# Patient Record
Sex: Female | Born: 1945 | Race: White | Hispanic: No | Marital: Married | Smoking: Never smoker
Health system: Southern US, Community
[De-identification: ages and names within clinical notes are randomized; demographics above are authoritative.]

## PROBLEM LIST (undated history)

## (undated) DIAGNOSIS — C679 Malignant neoplasm of bladder, unspecified: Secondary | ICD-10-CM

## (undated) DIAGNOSIS — F32A Depression, unspecified: Secondary | ICD-10-CM

## (undated) DIAGNOSIS — F329 Major depressive disorder, single episode, unspecified: Secondary | ICD-10-CM

## (undated) DIAGNOSIS — D494 Neoplasm of unspecified behavior of bladder: Secondary | ICD-10-CM

## (undated) DIAGNOSIS — I10 Essential (primary) hypertension: Secondary | ICD-10-CM

## (undated) DIAGNOSIS — H35039 Hypertensive retinopathy, unspecified eye: Secondary | ICD-10-CM

## (undated) DIAGNOSIS — K219 Gastro-esophageal reflux disease without esophagitis: Secondary | ICD-10-CM

## (undated) DIAGNOSIS — F419 Anxiety disorder, unspecified: Secondary | ICD-10-CM

## (undated) HISTORY — PX: TONSILLECTOMY: SUR1361

## (undated) HISTORY — PX: OTHER SURGICAL HISTORY: SHX169

## (undated) HISTORY — DX: Hypertensive retinopathy, unspecified eye: H35.039

---

## 1998-10-18 ENCOUNTER — Other Ambulatory Visit: Admission: RE | Admit: 1998-10-18 | Discharge: 1998-10-18 | Payer: Self-pay | Admitting: Gynecology

## 1999-12-12 ENCOUNTER — Other Ambulatory Visit: Admission: RE | Admit: 1999-12-12 | Discharge: 1999-12-12 | Payer: Self-pay | Admitting: Gynecology

## 2001-04-29 ENCOUNTER — Other Ambulatory Visit: Admission: RE | Admit: 2001-04-29 | Discharge: 2001-04-29 | Payer: Self-pay | Admitting: Gynecology

## 2002-05-11 ENCOUNTER — Other Ambulatory Visit: Admission: RE | Admit: 2002-05-11 | Discharge: 2002-05-11 | Payer: Self-pay | Admitting: Gynecology

## 2003-05-27 ENCOUNTER — Other Ambulatory Visit: Admission: RE | Admit: 2003-05-27 | Discharge: 2003-05-27 | Payer: Self-pay | Admitting: Gynecology

## 2005-02-26 ENCOUNTER — Other Ambulatory Visit: Admission: RE | Admit: 2005-02-26 | Discharge: 2005-02-26 | Payer: Self-pay | Admitting: Gynecology

## 2005-03-13 ENCOUNTER — Ambulatory Visit (HOSPITAL_COMMUNITY): Admission: RE | Admit: 2005-03-13 | Discharge: 2005-03-13 | Payer: Self-pay | Admitting: Urology

## 2005-03-13 ENCOUNTER — Encounter (INDEPENDENT_AMBULATORY_CARE_PROVIDER_SITE_OTHER): Payer: Self-pay | Admitting: Specialist

## 2005-03-13 ENCOUNTER — Ambulatory Visit (HOSPITAL_BASED_OUTPATIENT_CLINIC_OR_DEPARTMENT_OTHER): Admission: RE | Admit: 2005-03-13 | Discharge: 2005-03-13 | Payer: Self-pay | Admitting: Urology

## 2005-03-13 HISTORY — PX: OTHER SURGICAL HISTORY: SHX169

## 2006-01-28 ENCOUNTER — Ambulatory Visit (HOSPITAL_BASED_OUTPATIENT_CLINIC_OR_DEPARTMENT_OTHER): Admission: RE | Admit: 2006-01-28 | Discharge: 2006-01-28 | Payer: Self-pay | Admitting: Urology

## 2006-01-28 ENCOUNTER — Encounter (INDEPENDENT_AMBULATORY_CARE_PROVIDER_SITE_OTHER): Payer: Self-pay | Admitting: Specialist

## 2006-01-28 HISTORY — PX: OTHER SURGICAL HISTORY: SHX169

## 2007-11-12 ENCOUNTER — Ambulatory Visit: Payer: Self-pay | Admitting: Internal Medicine

## 2007-11-12 ENCOUNTER — Observation Stay (HOSPITAL_COMMUNITY): Admission: EM | Admit: 2007-11-12 | Discharge: 2007-11-13 | Payer: Self-pay | Admitting: Emergency Medicine

## 2010-07-04 ENCOUNTER — Ambulatory Visit
Admission: RE | Admit: 2010-07-04 | Discharge: 2010-07-04 | Payer: Self-pay | Source: Home / Self Care | Admitting: Urology

## 2010-07-04 HISTORY — PX: TRANSURETHRAL RESECTION OF BLADDER TUMOR WITH MITOMYCIN-C: SHX6459

## 2010-10-24 LAB — POCT HEMOGLOBIN-HEMACUE: Hemoglobin: 14.1 g/dL (ref 12.0–15.0)

## 2010-11-16 ENCOUNTER — Other Ambulatory Visit: Payer: Self-pay | Admitting: Gynecology

## 2010-12-26 NOTE — H&P (Signed)
Christy Cardenas, KRAUSZ               ACCOUNT NO.:  [id number]   MEDICAL RECORD NO.:  [id number]          PATIENT TYPE:  OBV   LOCATION:  4705                         FACILITY:  [facility]   PHYSICIAN:  Santiago Bumpers. Hensel, M.D.DATE OF BIRTH:  [birth year]   DATE OF ADMISSION:  11/12/2007  DATE OF DISCHARGE:                              HISTORY & PHYSICAL   CHIEF COMPLAINT:  Chest pain.   HISTORY OF PRESENT ILLNESS:  The patient is a 65 year old Caucasian  female with a history of bladder carcinoma here with chest pain and  syncope.  The chest pain started today after eating lunch at [employer].  She had a sandwich and fruit.  She did feel a little sick to her stomach  right after eating.  While she was driving home, she had left-sided  chest tightness that lasted a few seconds and then completely went away.  She also had left arm numbness at the time.  She had felt a very itchy  tightness after this as well.  She did take two aspirin at home and  checked the blood pressure which was around 140s/90s.  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  While she was getting this done, she had another  episode of chest pain which seemed more painful than tight and lasted a  few seconds during which she had a syncopal episode that lasted  approximately 30 seconds with bilateral arm numbness after the episode.  Her heart rate at the fire department was 95 at first and next check was  53.  Her blood pressure was 158/100 and next check with 82.  She was  diaphoretic at the time.  Of note, she has had lots of family stress.  Her mother-in-law died recently.  She was on Hospice, but expected her  to live longer than she did.  Also, the patient's mom has Alzheimer's  disease and has worsening issues, starting to forget things, that is  making her daughter upset.  She has not forgotten her yet.  Also, the  patient's daughter recently broke off  her engagement.  Currently, she  denies chest pain and denies shortness of breath.  No nausea, vomiting  or diarrhea.  No other syncopal events.  No dizziness.  No fevers.  No  cough.  No weakness or numbness currently.   MEDICATIONS:  1. Lexapro 10 mg p.o. daily.  She started taking the Lexapro recently      due to increased family stress.  She was on this previously because      of adjustment disorder from her bladder carcinoma.  2. Ambien CR p.r.n. insomnia.  3. Calcium.  4. Omega-3 fatty acids.  5. Zantac p.r.n. reflux.   ALLERGIES:  No known drug allergies.   PAST MEDICAL HISTORY:  1. 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  She has  cystoscopy every 3 months by Dr. Vonita Moss with Urology.  2. History of mood disorder, likely adjustment disorder while dealing      with cancer.  She was on Lexapro and is currently starting this      again.  3. Occasional heartburn, about 2 times a week.  She takes Zantac      p.r.n.   FAMILY HISTORY:  Mom with history of atrial fibrillation, hypertension  and Alzheimer's.  Dad died at 88 years old from leukemia.  He also had  an MI when he was 64 years old.  She has no siblings.   SOCIAL HISTORY:  She is a retired Engineer, site, Investment banker, corporate in  high school.  She denies any history of smoking.  She denies illicit  drug history.  Occasional social alcohol use.  She lives with her  husband.  As per HPI, her mother-in-law died recently.  She was on  Hospice, but died earlier than expected and her mom has Alzheimer's and  is not doing well and daughter recently broke off engagement; thus, she  has added stress in her life.   PHYSICAL EXAMINATION:  VITAL SIGNS: Temperature 98.2.  Respiratory rate  18.  Blood pressure 135/92, this is down from 154/97.  She is 98% on  room air.  GENERAL  APPEARANCE:  She is alert and in no acute distress.  HEENT:  Pupils are equal, round and reactive to light.  Extraocular  muscles are intact.  Oropharynx is without erythema.  Mucous membranes  are moist  NECK:  No thyromegaly.  No lymphadenopathy.  CARDIOVASCULAR:  Heart has regular rate and rhythm.  No murmurs, rubs or  gallops.  Pedal and radial pulses are 2+.  ABDOMEN:  Soft, nondistended and nontender.  Normoactive bowel sounds.  No hepatosplenomegaly.  No masses.  EXTREMITIES: No clubbing, cyanosis or edema.  Strength is 5/5  bilaterally upper and lower.  NEURO:  Alert and oriented x3.  Cranial nerves II-XII are intact.  PSYCH:  Slightly anxious appearing, but very alert and cooperative and  appropriate on exam.   LABS AND STUDIES:  White blood cell count 6.9, hemoglobin 13.6,  hematocrit 39.0, platelets 156, MCV 93.1, neutrophils 76%.  BMET:  Sodium 139, potassium 3.5, chloride 109, bicarb 24, BUN 15, creatinine  1.0, glucose 121, calcium 9.2, total protein 6.1, albumin 3.7, AST 20,  ALT 16, ALP 38, PTT 22.  Point of care enzymes are negative x3.  Chest x-  ray showed some hyperexpansion but no acute findings.  EKG:  Normal  sinus rhythm, 76 beats per minute, normal axis, no ST changes.   ASSESSMENT:  The patient is a 65 year old Caucasian female with a  history of bladder cancer and question of adjustment disorder here with  chest pain and syncope.   PLAN:  1. Chest pain:  The chest pain itself is atypical and point of care      enzymes and EKG have been normal.  I doubt cardiac etiology, but      given syncope, we will continue ruling out with cardiac enzymes x3,      check a morning EKG.  We will also check a fasting lipid panel and      a TSH.  Given history of occasional reflux, we will also treat with      Protonix in case of some GI source of her chest pain.  We will      place on observation.  Most likely her chest pain  is due, at least      in some part, to recent  anxiety and stress over family events and      circumstances.  We will place on telemetry and she will have an      outpatient cardiology evaluation.  2. Elevated blood pressure:  This seems to be going down.  We will      continue to monitor.  It is currently almost normal.  If it      continues to be elevated, we will add a beta blocker.  3. Syncope:  Unclear etiology, possibly due to anxiety over chest pain      and hyperventilation.  No neurological deficits on exam.  We will      continue her rule out MI workup and will not further workup syncope      unless lab work, EKG or other findings become positive.  We will      discuss whether or not any further workup is necessary for this.  4. Recent stressors:  We will continue her on her Lexapro that she      started taking recently after her mother-in-law      died.  I also advised her to have some rest and relaxation if      possible  5. Bladder cancer:  This is stable.  Per Urology as outpatient.  She      has cystoscopy every 3 months.   DISPOSITION:  Pending rule out MI and outpatient cardiology followup.      Alanda Amass, M.D.  Electronically Signed      Santiago Bumpers. Leveda Anna, M.D.  Electronically Signed    JH/MEDQ  D:  11/12/2007  T:  11/13/2007  Job:  [id number]   cc:   Luvenia Redden, M.D.  Maretta Bees. Vonita Moss, M.D.

## 2010-12-26 NOTE — Discharge Summary (Signed)
Christy Cardenas, Christy Cardenas               ACCOUNT NO.:  [id number]   MEDICAL RECORD NO.:  [id number]          PATIENT TYPE:  OBV   LOCATION:  4705                         FACILITY:  [facility]   PHYSICIAN:  Lauro Franklin, MDDATE OF BIRTH:  04/06/1946   DATE OF ADMISSION:  11/12/2007  DATE OF DISCHARGE:  11/13/2007                               DISCHARGE SUMMARY   PRIMARY CARE Cherrie Franca:  None.   REASON FOR ADMISSION:  Chest pain and syncopal episode.   DISCHARGE DIAGNOSES:  1. Vasovagal syncope secondary to vasovagal episodes.  2. Adjustment disorder.  3. Multiple stressors.  4. History of bladder cancer.   DISCHARGE MEDICATIONS:  Lexapro 10 mg p.o. daily.   HOSPITAL COURSE:  1. Chest pain:  The patient was admitted secondary to having several      episodes of itchy chest tightness.  She was ruled out for acute      coronary syndrome with 3 sets of negative point-of-care cardiac      markers as well as 3 sets of negative lab cardiac markers.  She did      not have any EKG changes or any concerning evidence on telemetry.      With the patient's descriptions and multiple current stressors, it      is likely the chest tightness is secondary to anxiety/stress and      possibly there is some complaint of heartburn as well.  Given her      minimal risk factors, I do not believe she requires further      inpatient workup.  She could potentially get an outpatient stress      treadmill test for any probability or suspicion for any coronary      artery disease.  2. Syncope:  As the patient was having pain episodes, she presented to      our department and was placed on a monitor.  There was an      approximately 30-second episode of a blacking out, and she did not      have any EKG changes and had decrease in her heart rate and her      blood pressure consistent with vasovagal syncope.  This did not      recur while she was in the hospital.  She did not have any      arrhythmias on telemetry  or on her EKG.  Therefore, no further      workup was pursued at this time.  If this recurs, the patient will      need further evaluation.  3. Bladder cancer:  The patient is followed by urologist and undergoes      cystoscopy every 3 months.  4. Adjustment disorder:  The patient was started on Lexapro in the      past and she had recently begun to take it intermittently.  She was      counseled that given her multiple life stressors currently and the      pharmacokinetics of the medication to take it daily.  She expressed      understanding.  She should  continue this, especially given some      possible anxiety/stress component to her chest pain.   CONDITION AT THE TIME OF DISCHARGE:  Improved.   DISCHARGE LABS:  Triglycerides 70, HDL 51, LDL 144, TSH 1.967, BUN 11,  creatinine 0.93, hemoglobin 13.6.   DISPOSITION:  The patient is discharged home.   DISCHARGE FOLLOWUP:  The patient was discharged to establish care with  primary care Christy Cardenas for overall health management, however, there is  no urgent need for her to be seen by an outpatient physician.   FOLLOWUP ISSUES:  1. If syncope recurs, the patient will need further evaluation.  2. Assessed by her primary care Christy Cardenas.      Lauro Franklin, MD  Electronically Signed     TCB/MEDQ  D:  11/13/2007  T:  11/14/2007  Job:  [id number]

## 2010-12-29 NOTE — Op Note (Signed)
Christy Cardenas, Christy Cardenas               ACCOUNT NO.:  [id number]   MEDICAL RECORD NO.:  [id number]          PATIENT TYPE:  AMB   LOCATION:  [facility]                         FACILITY:  [facility]   PHYSICIAN:  Maretta Bees. Vonita Moss, M.D.DATE OF BIRTH:  02/17/1946   DATE OF PROCEDURE:  03/13/2005  DATE OF DISCHARGE:                                 OPERATIVE REPORT   PREOPERATIVE DIAGNOSIS:  Bladder carcinoma.   POSTOPERATIVE DIAGNOSIS:  Bladder carcinoma.   PROCEDURE:  Cystoscopy, urethral dilation, bilateral retrograde pyelograms  with interpretation and transurethral resection of bladder tumor.   SURGEON:  Dr. Larey Dresser.   ANESTHESIA:  General.   INDICATIONS:  This 65 year old white female had the onset of gross hematuria  about six weeks ago and is sent to me for further evaluation. Pelvic  ultrasound  was suspicious for a bladder lesion. Renal ultrasound in the  office last week showed normal kidneys. Cystoscopy last week revealed  typical papillary tumor in the bladder. She was scheduled for resection of  this bladder tumor today and retrograde pyelography.   DESCRIPTION OF PROCEDURE:  The patient was brought to the operating room,  placed in lithotomy positio, the external genitalia were prepped and draped  in the usual fashion. She was cystoscoped only after dilating the urethra.  The bladder was unremarkable except for a solitary 3 cm typical papillary  tumor on the right floor of the bladder.   Bilateral retrograde pyelograms were obtained with a cone-tip catheter and  revealed nonobstructed and normal pyelocaliceal systems and ureters with no  filling defects.   The resectoscope was then inserted and this tumor was resected completely.  Fortunately it had a fairly narrow stalk for this 3 cm lesion and the total  specimen was sent to pathology and the biopsy site and surrounding mucosa  was fulgurated with electrocautery. At this point, there was no blood loss.  The patient was in  good condition, taken to the recovery room. She tolerated  the procedure well.     _________________    LJP/MEDQ  D:  03/13/2005  T:  03/13/2005  Job:  [id number]   cc:   Luvenia Redden, M.D.  [address]  [place]  [place] [address]  Fax: [phone]   Demetria Pore. Coral Spikes, M.D.  [address]  [address]  [place]  [place] [address]  Fax: [phone]

## 2010-12-29 NOTE — Op Note (Signed)
NAMEJOLENE, Christy Cardenas               ACCOUNT NO.:  [id number]   MEDICAL RECORD NO.:  [id number]          PATIENT TYPE:  AMB   LOCATION:  [facility]                         [facility]ACILITY:  [facility]   PHYSICIAN:  Maretta Bees. Vonita Moss, M.D.DATE OF BIRTH:  May 06, 1946   DATE OF PROCEDURE:  01/28/2006  DATE OF DISCHARGE:                                 OPERATIVE REPORT   PREOPERATIVE DIAGNOSIS:  Recurrent bladder carcinoma.   POSTOPERATIVE DIAGNOSIS:  Recurrent bladder carcinoma.   PROCEDURE:  Cystoscopy and cold-cup bladder biopsy of three small bladder  tumors and fulguration.   SURGEON:  Dr. Larey Dresser.   ANESTHESIA:  General.   INDICATIONS:  This 65 year old lady had a bladder tumor resected in August  2006 that was superficial and noninvasive, and she has come back for  surveillance cystoscopy.  In her last examination, she was found to have  recurrent bladder tumor.  She was brought to the OR today for further  treatment and then subsequent initiation of BCG installations in the office.   PROCEDURE:  The patient was brought to the operating room and placed in  lithotomy position.  External genitalia were prepped and draped in usual  fashion.  She was cystoscoped, and she had three small papillary tumors on  the right bladder floor.  All three lesions were totally resected with the  cold-cup bladder biopsy forceps, and the biopsy sites fulgurated with the  Bugbee electrode.  There was essentially no blood loss.  The patient  tolerated the procedure well and was taken to recovery room in good  condition.      Maretta Bees. Vonita Moss, M.D.  Electronically Signed     LJP/MEDQ  D:  01/28/2006  T:  01/29/2006  Job:  [id number]

## 2011-03-23 ENCOUNTER — Inpatient Hospital Stay: Payer: Self-pay | Admitting: Orthopedic Surgery

## 2011-03-23 DIAGNOSIS — I10 Essential (primary) hypertension: Secondary | ICD-10-CM

## 2011-05-08 LAB — POCT CARDIAC MARKERS
Myoglobin, poc: 38.5
Myoglobin, poc: 73.7
Operator id: 146091
Operator id: 146091

## 2011-05-08 LAB — CARDIAC PANEL(CRET KIN+CKTOT+MB+TROPI)
CK, MB: 1.8
Relative Index: INVALID
Total CK: 97
Troponin I: 0.01
Troponin I: <0.01
Troponin I: <0.01

## 2011-05-08 LAB — COMPREHENSIVE METABOLIC PANEL
ALT: 16
Albumin: 3.7
Alkaline Phosphatase: 38 — ABNORMAL LOW
BUN: 15
GFR calc Af Amer: >60
Potassium: 3.5
Sodium: 139
Total Protein: 6.1

## 2011-05-08 LAB — LIPID PANEL
HDL: 51
Total CHOL/HDL Ratio: 4.1
VLDL: 14

## 2011-05-08 LAB — BASIC METABOLIC PANEL
BUN: 11
Calcium: 9
Creatinine, Ser: 0.93
GFR calc non Af Amer: >60
Potassium: 4

## 2011-05-08 LAB — TSH: TSH: 1.967

## 2011-05-08 LAB — DIFFERENTIAL
Basophils Relative: 1
Monocytes Absolute: 0.5
Monocytes Relative: 7
Neutro Abs: 5.3

## 2011-05-08 LAB — CBC
Platelets: 156
RDW: 13.2

## 2011-07-27 ENCOUNTER — Other Ambulatory Visit: Payer: Self-pay | Admitting: Urology

## 2011-07-27 ENCOUNTER — Encounter (HOSPITAL_COMMUNITY): Payer: Self-pay | Admitting: *Deleted

## 2011-07-27 ENCOUNTER — Encounter (HOSPITAL_COMMUNITY): Payer: Self-pay | Admitting: Pharmacy Technician

## 2011-07-30 MED ORDER — MITOMYCIN CHEMO FOR BLADDER INSTILLATION 40 MG: 40.0000 mg | Freq: Once | INTRAVENOUS | Status: DC

## 2011-07-30 MED ORDER — MITOMYCIN CHEMO FOR BLADDER INSTILLATION 40 MG
40.0000 mg | Freq: Once | INTRAVENOUS | Status: DC
  Filled 2011-07-30: qty 40

## 2011-07-30 NOTE — H&P (Addendum)
History of Present Illness            F/u bladder cancer.  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  -- Nov 2011 TURBT, Bilat RGP and post-op [facility] for a 2cm left lateral tumor. RGP's were negative. 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  -- Jan 2012 completed BCG second course -- Apr 2012 negative cystoscopy -- Sept 2012 negative cystocopy. She did not want to start maintenance BCG.   Today, she is well. No pain. No dysuria or hematuria.      Past Medical History Problems  1. History of  Bladder Cancer V10.51 2. History of  Bladder Cancer V10.51 3. History of  Urinary Tract Infection V13.02  Surgical History Problems  1. History of  Bladder Surgery 2. History of  Cystoscopy With Fulguration Medium Lesion (2-5cm)  Current Meds 1. Amoxicillin-Pot Clavulanate 875-125 MG Oral Tablet; Therapy: 29Nov2012 to 2. Calcium TABS; Therapy: (Recorded:21Apr2008) to 3. Escitalopram Oxalate 10 MG Oral Tablet; TAKE 1 TABLET BY MOUTH EVERY DAY; Therapy:  05Apr2012 to 4. Multi-Vitamin TABS; Therapy: (Recorded:21Apr2008) to 5. Premarin 0.625 MG/GM Vaginal Cream; INSERT 1 APPLICATORFUL(1-2 GRAMS) 3 TIMES  WEEKLY; Therapy: 19Apr2012 to  Allergies Medication  1. No Known Drug Allergies  Family History Problems  1. Paternal history of  Family Health Status Number Of Children 1-SON 1-DAUGHTER 2. Paternal history of  Leukemia V16.6 3. Maternal history of  Nephrolithiasis  Social History Problems  1. Alcohol Use Socially 2. Caffeine Use 2 3. Paternal history of  Death In The Family Father 4. Marital History - Currently Married 5. 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 No acute distress. Amended By: Jerilee Field; 07/17/2011 3:06 PMEST.  ENT:. The ears and nose are normal in appearance. Amended By: Jerilee Field; 07/17/2011 3:06 PMEST.  Neck: The appearance of the neck is normal Amended By: Jerilee Field; 07/17/2011 3:06 PMEST  and no neck mass is present Amended By: Jerilee Field; 07/17/2011 3:06 PMEST.  Pulmonary: No respiratory distress Amended By: Jerilee Field; 07/17/2011 3:06 PMEST  and normal respiratory rhythm and effort Amended By: Jerilee Field; 07/17/2011 3:06 PMEST.  Cardiovascular: Heart rate and rhythm are normal Amended By: Jerilee Field; 07/17/2011 3:06 PMEST . No peripheral edema. Amended By: Jerilee Field; 07/17/2011 3:06 PMEST.  Abdomen: The abdomen is soft and nontender Amended By: Jerilee Field; 07/17/2011 3:06 PMEST No masses are palpated Amended By: Jerilee Field; 07/17/2011 3:06 PMEST No CVA tenderness Amended By: Jerilee Field; 07/17/2011 3:06 PMEST. No hernias are palpable Amended By: Jerilee Field; 07/17/2011 3:06 PMEST No hepatosplenomegaly noted Amended By: Jerilee Field; 07/17/2011 3:06 PMEST  Genitourinary: Examination of the external genitalia shows normal female external genitalia Amended By: Jerilee Field; 07/17/2011 3:06 PMEST  and no lesions Amended By: Jerilee Field; 07/17/2011 3:06 PMEST. The urethra is normal in appearance Amended By: Jerilee Field; 07/17/2011 3:06 PMEST  and not tender Amended By: Jerilee Field; 07/17/2011 3:06 PMEST. There is no urethral mass Amended By: Jerilee Field; 07/17/2011 3:06 PMEST Vaginal exam demonstrates no  abnormalities Amended By: Jerilee Field; 07/17/2011 3:06 PMEST. The adnexa are palpably normal Amended By: Jerilee Field; 07/17/2011 3:06 PMEST.  The bladder is non tender Amended By: Jerilee Field; 07/17/2011 3:06 PMEST , not distended Amended By: Jerilee Field; 07/17/2011 3:06 PMEST  and without masses Amended By: Jerilee Field; 07/17/2011 3:06 PMEST. The anus is normal on inspection Amended By: Jerilee Field; 07/17/2011 3:06 PMEST The perineum is normal on inspection. Amended By: Jerilee Field; 07/17/2011 3:06 PMEST.  Lymphatics: The femoral Amended By: Jerilee Field; 07/17/2011 3:06 PMEST and inguinal Amended By: Jerilee Field; 07/17/2011 3:06 PMEST nodes are not enlarged or tender.  Skin: Normal skin turgor Amended By: Jerilee Field; 07/17/2011 3:06 PMEST , no visible rash Amended By: Jerilee Field; 07/17/2011 3:06 PMEST  and no visible skin lesions Amended By: Jerilee Field; 07/17/2011 3:06 PMEST.  Neuro/Psych:. Mood and affect are appropriate. Amended By: Jerilee Field; 07/17/2011 3:06 PMEST.    Results/Data Urine [Data Includes: Last 1 Day]   04Dec2012  COLOR YELLOW   APPEARANCE CLEAR   SPECIFIC GRAVITY 1.020   pH 6.0   GLUCOSE NEG mg/dL  BILIRUBIN NEG   KETONE NEG mg/dL  BLOOD NEG   PROTEIN NEG mg/dL  UROBILINOGEN 0.2 mg/dL  NITRITE NEG   LEUKOCYTE ESTERASE NEG    Procedure  Procedure: Cystoscopy  Informed Consent: from the patient . Specific risks including, but not limited to bleeding, infection, pain, allergic reaction etc. were explained.  Prep: The patient was prepped with betadine.  Procedure Note:  Urethral meatus:. No abnormalities.  Anterior urethra: No abnormalities.  Bladder: Visulization was clear. The ureteral orifices were in the normal anatomic position bilaterally and had clear efflux of urine. A systematic survey of the bladder demonstrated no bladder tumors or stones.  The mucosa at the prior resection sites - right lateral and left lateral had neovascularity c/w prior resection and some erythema. The mucosa was smooth without abnormalities. The patient  tolerated the procedure well.  Complications: None.    Assessment Assessed  1. Bladder Cancer 188.9   Recurrent LG Ta bladder Ca. Cytology sent today.   Plan Bladder Cancer (188.9)  1. Cysto 2. URINE CYTOLOGY  Requested for: 04Dec2012 3. Follow-up Month x 1 Office  Follow-up  Requested for: 04Dec2012 Health Maintenance (V70.0)  4. UA With REFLEX  Done: 04Dec2012 02:06PM  Patient cytology suspicious for malignancy (rule out low grade neoplasm). I discussed with patient the nature, potential benefits, risks and alternatives to cystoscopy, bladder biopsy, bilateral RGP possible ureteroscopy/stent, instill Mitpmycin-C, including side effects of the proposed treatment, the likelihood of the patient achieving the goals of the procedure, and any potential problems that might occur during the procedure or recuperation. All questions answered. Patient elects to proceed as planned. We discussed alternatives such as non-treatment.

## 2011-07-31 ENCOUNTER — Ambulatory Visit (HOSPITAL_COMMUNITY): Payer: BC Managed Care – PPO | Admitting: Registered Nurse

## 2011-07-31 ENCOUNTER — Encounter (HOSPITAL_COMMUNITY): Payer: Self-pay | Admitting: *Deleted

## 2011-07-31 ENCOUNTER — Encounter (HOSPITAL_COMMUNITY): Payer: Self-pay | Admitting: Registered Nurse

## 2011-07-31 ENCOUNTER — Other Ambulatory Visit: Payer: Self-pay | Admitting: Urology

## 2011-07-31 ENCOUNTER — Encounter (HOSPITAL_COMMUNITY)
Admission: RE | Disposition: A | Discharge status: Home / Self Care | Payer: Self-pay | Source: Ambulatory Visit | Attending: Urology

## 2011-07-31 ENCOUNTER — Ambulatory Visit (HOSPITAL_COMMUNITY)
Admission: RE | Admit: 2011-07-31 | Discharge: 2011-07-31 | Disposition: A | Discharge status: Home / Self Care | Payer: BC Managed Care – PPO | Source: Ambulatory Visit | Attending: Urology | Admitting: Urology

## 2011-07-31 DIAGNOSIS — Z79899 Other long term (current) drug therapy: Secondary | ICD-10-CM | POA: Insufficient documentation

## 2011-07-31 DIAGNOSIS — C679 Malignant neoplasm of bladder, unspecified: Secondary | ICD-10-CM | POA: Insufficient documentation

## 2011-07-31 DIAGNOSIS — R9389 Abnormal findings on diagnostic imaging of other specified body structures: Secondary | ICD-10-CM | POA: Insufficient documentation

## 2011-07-31 HISTORY — DX: Malignant neoplasm of bladder, unspecified: C67.9

## 2011-07-31 HISTORY — PX: CYSTOSCOPY WITH BIOPSY: SHX5122

## 2011-07-31 HISTORY — PX: CYSTOSCOPY/RETROGRADE/URETEROSCOPY: SHX5316

## 2011-07-31 LAB — CBC
HCT: 39.8 % (ref 36.0–46.0)
Hemoglobin: 13.2 g/dL (ref 12.0–15.0)
MCHC: 33.2 g/dL (ref 30.0–36.0)
MCV: 95.2 fL (ref 78.0–100.0)

## 2011-07-31 LAB — SURGICAL PCR SCREEN: Staphylococcus aureus: NEGATIVE

## 2011-07-31 SURGERY — CYSTOSCOPY/RETROGRADE/URETEROSCOPY
Anesthesia: General | Site: Ureter | Wound class: Clean Contaminated

## 2011-07-31 MED ORDER — MUPIROCIN 2 % EX OINT
TOPICAL_OINTMENT | CUTANEOUS | Status: AC
  Filled 2011-07-31: qty 22

## 2011-07-31 MED ORDER — PHENAZOPYRIDINE HCL 200 MG PO TABS: 200.0000 mg | ORAL_TABLET | Freq: Three times a day (TID) | ORAL | Status: AC | PRN

## 2011-07-31 MED ORDER — MIDAZOLAM HCL 5 MG/5ML IJ SOLN
INTRAMUSCULAR | Status: DC | PRN
  Administered 2011-07-31: 2 mg via INTRAVENOUS

## 2011-07-31 MED ORDER — SODIUM CHLORIDE 0.9 % IR SOLN
Status: DC | PRN
  Administered 2011-07-31: 3000 mL

## 2011-07-31 MED ORDER — CEFAZOLIN SODIUM 1-5 GM-% IV SOLN
INTRAVENOUS | Status: AC
  Filled 2011-07-31: qty 50

## 2011-07-31 MED ORDER — OXYCODONE-ACETAMINOPHEN 5-325 MG PO TABS: 1.0000 | ORAL_TABLET | ORAL | Status: AC | PRN

## 2011-07-31 MED ORDER — PROPOFOL 10 MG/ML IV EMUL
INTRAVENOUS | Status: DC | PRN
  Administered 2011-07-31: 200 mg via INTRAVENOUS

## 2011-07-31 MED ORDER — IOHEXOL 300 MG/ML  SOLN
INTRAMUSCULAR | Status: DC | PRN
  Administered 2011-07-31: 20 mL

## 2011-07-31 MED ORDER — SULFAMETHOXAZOLE-TRIMETHOPRIM 800-160 MG PO TABS: 1.0000 | ORAL_TABLET | Freq: Two times a day (BID) | ORAL | Status: AC

## 2011-07-31 MED ORDER — STERILE WATER FOR IRRIGATION IR SOLN
Status: DC | PRN
  Administered 2011-07-31: 3000 mL

## 2011-07-31 MED ORDER — IOHEXOL 300 MG/ML  SOLN
INTRAMUSCULAR | Status: AC
  Filled 2011-07-31: qty 1

## 2011-07-31 MED ORDER — LACTATED RINGERS IV SOLN
INTRAVENOUS | Status: DC
  Administered 2011-07-31: 1000 mL via INTRAVENOUS

## 2011-07-31 MED ORDER — DEXAMETHASONE SODIUM PHOSPHATE 10 MG/ML IJ SOLN
INTRAMUSCULAR | Status: DC | PRN
  Administered 2011-07-31: 10 mg via INTRAVENOUS

## 2011-07-31 MED ORDER — DOCUSATE SODIUM 100 MG PO CAPS: 100.0000 mg | ORAL_CAPSULE | Freq: Two times a day (BID) | ORAL | Status: AC

## 2011-07-31 MED ORDER — FENTANYL CITRATE 0.05 MG/ML IJ SOLN
INTRAMUSCULAR | Status: DC | PRN
  Administered 2011-07-31 (×2): 25 ug via INTRAVENOUS
  Administered 2011-07-31: 50 ug via INTRAVENOUS

## 2011-07-31 MED ORDER — CEFAZOLIN SODIUM 1-5 GM-% IV SOLN
1.0000 g | INTRAVENOUS | Status: AC
  Administered 2011-07-31: 1 g via INTRAVENOUS

## 2011-07-31 MED ORDER — FENTANYL CITRATE 0.05 MG/ML IJ SOLN: 25.0000 ug | INTRAMUSCULAR | Status: DC | PRN

## 2011-07-31 MED ORDER — LIDOCAINE HCL (CARDIAC) 20 MG/ML IV SOLN
INTRAVENOUS | Status: DC | PRN
  Administered 2011-07-31: 80 mg via INTRAVENOUS

## 2011-07-31 MED ORDER — PROMETHAZINE HCL 25 MG/ML IJ SOLN: 6.2500 mg | INTRAMUSCULAR | Status: DC | PRN

## 2011-07-31 MED ORDER — EPHEDRINE SULFATE 50 MG/ML IJ SOLN
INTRAMUSCULAR | Status: DC | PRN
  Administered 2011-07-31 (×2): 5 mg via INTRAVENOUS

## 2011-07-31 MED ORDER — LACTATED RINGERS IV SOLN: INTRAVENOUS | Status: DC

## 2011-07-31 MED ORDER — ONDANSETRON HCL 4 MG/2ML IJ SOLN
INTRAMUSCULAR | Status: DC | PRN
  Administered 2011-07-31: 4 mg via INTRAVENOUS

## 2011-07-31 SURGICAL SUPPLY — 25 items
ADAPTER CATH URET PLST 4-6FR (CATHETERS) ×3 IMPLANT
BAG URO CATCHER STRL LF (DRAPE) ×3 IMPLANT
BASKET STNLS GEMINI 4WIRE 3FR (BASKET) IMPLANT
CATH INTERMIT  6FR 70CM (CATHETERS) ×3 IMPLANT
CATH ROBINSON RED A/P 16FR (CATHETERS) IMPLANT
CATH URET 5FR 28IN CONE TIP (BALLOONS)
CATH URET 5FR 70CM CONE TIP (BALLOONS) IMPLANT
CLOTH BEACON ORANGE TIMEOUT ST (SAFETY) ×3 IMPLANT
DRAPE CAMERA CLOSED 9X96 (DRAPES) ×3 IMPLANT
ELECT REM PT RETURN 9FT ADLT (ELECTROSURGICAL) ×3
ELECTRODE REM PT RTRN 9FT ADLT (ELECTROSURGICAL) ×2 IMPLANT
GLOVE BIOGEL M STRL SZ7.5 (GLOVE) ×3 IMPLANT
GOWN PREVENTION PLUS XLARGE (GOWN DISPOSABLE) ×3 IMPLANT
GOWN STRL NON-REIN LRG LVL3 (GOWN DISPOSABLE) ×3 IMPLANT
GOWN STRL REIN XL XLG (GOWN DISPOSABLE) ×3 IMPLANT
GUIDEWIRE ANG ZIPWIRE 038X150 (WIRE) IMPLANT
GUIDEWIRE STR DUAL SENSOR (WIRE) ×6 IMPLANT
MANIFOLD NEPTUNE II (INSTRUMENTS) ×3 IMPLANT
NDL SAFETY ECLIPSE 18X1.5 (NEEDLE) IMPLANT
NEEDLE HYPO 18GX1.5 SHARP (NEEDLE)
NEEDLE HYPO 22GX1.5 SAFETY (NEEDLE) IMPLANT
PACK CYSTO (CUSTOM PROCEDURE TRAY) ×3 IMPLANT
STENT CONTOUR URETERAL (STENTS) ×3 IMPLANT
TUBING CONNECTING 10 (TUBING) ×3 IMPLANT
WATER STERILE IRR 3000ML UROMA (IV SOLUTION) ×3 IMPLANT

## 2011-07-31 NOTE — Brief Op Note (Signed)
07/31/2011  11:13 AM  PATIENT:  Christy Cardenas  65 y.o. female  PRE-OPERATIVE DIAGNOSIS:  Bladder Cancer  POST-OPERATIVE DIAGNOSIS:  bladder cancer  PROCEDURE:  Procedure(s): Exam under anesthesia.  CYSTOSCOPY/ left and right RETROGRADE/left and right URETEROSCOPY CYSTOSCOPY WITH BIOPSY and fulguration  SURGEON:  Surgeon(s): Antony Haste, MD  ANESTHESIA:   gen   Findings: EUA - normal abd no mass, normal urethra and bladder. Cysto - "rippled" mucosa over the right bladder base. No other lesion.   Left RGP - filling defect distal ureter at UVJ, otherwise normal distal, mid and prox ureter; normal renal pelvis and collecting system  Left URS - normal distal and mid ureter.   Right RGP - filling defect and dilation distal ureter, filling defect right upper infundibulum with dilated upper pole  Right URS - normal ureter, normal renal pelvis and collecting system (upper infundibulum normal connecting to a compound RUP calyx)   EBL:  Total I/O In: 1700 [I.V.:1700] Out: 25 [Blood:25]  BLOOD ADMINISTERED:none  DRAINS: 6 x 24 cm right ureteral stent  LOCAL MEDICATIONS USED:  NONE  SPECIMEN:  No Specimen and Biopsy / Limited Resection - right bladder biopsy x 2   DISPOSITION OF SPECIMEN:  PATHOLOGY  COUNTS:  YES  DICTATION: [id number]   PLAN OF CARE: Discharge to home after PACU  PATIENT DISPOSITION:  PACU - hemodynamically stable.   Delay start of Pharmacological VTE agent (>24hrs) due to surgical blood loss or risk of bleeding:  {YES/NO/NOT APPLICABLE:20182

## 2011-07-31 NOTE — Anesthesia Preprocedure Evaluation (Addendum)
Anesthesia Evaluation  Patient identified by MRN, date of birth, ID band Patient awake    Reviewed: Allergy & Precautions, H&P , NPO status , Patient's Chart, lab work & pertinent test results  Airway Mallampati: II TM Distance: >3 FB Neck ROM: full    Dental No notable dental hx. (+) Teeth Intact and Dental Advisory Given   Pulmonary neg pulmonary ROS,  clear to auscultation  Pulmonary exam normal       Cardiovascular Exercise Tolerance: Good neg cardio ROS regular Normal    Neuro/Psych Negative Neurological ROS  Negative Psych ROS   GI/Hepatic negative GI ROS, Neg liver ROS,   Endo/Other  Negative Endocrine ROS  Renal/GU negative Renal ROS  Genitourinary negative   Musculoskeletal   Abdominal   Peds  Hematology negative hematology ROS (+)   Anesthesia Other Findings   Reproductive/Obstetrics negative OB ROS                          Anesthesia Physical Anesthesia Plan  ASA: I  Anesthesia Plan: General   Post-op Pain Management:    Induction: Intravenous  Airway Management Planned: LMA  Additional Equipment:   Intra-op Plan:   Post-operative Plan:   Informed Consent: I have reviewed the patients History and Physical, chart, labs and discussed the procedure including the risks, benefits and alternatives for the proposed anesthesia with the patient or authorized representative who has indicated his/her understanding and acceptance.   Dental Advisory Given  Plan Discussed with: CRNA and Surgeon  Anesthesia Plan Comments:         Anesthesia Quick Evaluation  

## 2011-07-31 NOTE — Transfer of Care (Signed)
Immediate Anesthesia Transfer of Care Note  Patient: Christy Cardenas  Procedure(s) Performed:  CYSTOSCOPY/RETROGRADE/URETEROSCOPY - CYSTOSCOPY BILATERAL RETROGRADE PYELOGRAM URETEROSCOPY Right ureteral stent placement  C-ARM ; CYSTOSCOPY WITH BIOPSY - BLADDER BIOPSY  Patient Location: PACU  Anesthesia Type: General  Level of Consciousness: awake, alert  and oriented  Airway & Oxygen Therapy: Patient Spontanous Breathing and Patient connected to face mask oxygen  Post-op Assessment: Report given to PACU RN and Post -op Vital signs reviewed and stable  Post vital signs: Reviewed and stable  Complications: No apparent anesthesia complications

## 2011-07-31 NOTE — Preoperative (Signed)
Beta Blockers   Reason not to administer Beta Blockers:Not Applicable 

## 2011-07-31 NOTE — Anesthesia Postprocedure Evaluation (Signed)
  Anesthesia Post-op Note  Patient: Christy Cardenas  Procedure(s) Performed:  CYSTOSCOPY/RETROGRADE/URETEROSCOPY - CYSTOSCOPY BILATERAL RETROGRADE PYELOGRAM URETEROSCOPY Right ureteral stent placement  C-ARM ; CYSTOSCOPY WITH BIOPSY - BLADDER BIOPSY  Patient Location: PACU  Anesthesia Type: General  Level of Consciousness: awake and alert   Airway and Oxygen Therapy: Patient Spontanous Breathing  Post-op Pain: mild  Post-op Assessment: Post-op Vital signs reviewed, Patient's Cardiovascular Status Stable, Respiratory Function Stable, Patent Airway and No signs of Nausea or vomiting  Post-op Vital Signs: stable  Complications: No apparent anesthesia complications

## 2011-07-31 NOTE — Op Note (Signed)
Christy Cardenas, LYBARGER NO.:  [id number]  MEDICAL RECORD NO.:  [id number]  LOCATION:  [facility]                         FACILITY:  [facility]  PHYSICIAN:  Jerilee Field, MD   DATE OF BIRTH:  1946-07-03  DATE OF PROCEDURE: DATE OF DISCHARGE:                              OPERATIVE REPORT   PREOPERATIVE DIAGNOSIS:  Bladder cancer.  POSTOPERATIVE DIAGNOSIS:  Bladder cancer.  PROCEDURE:  Exam under anesthesia with cystoscopy, bladder biopsy and fulguration, left retrograde pyelogram, left ureteroscopy, right retrograde pyelogram, right ureteroscopy and right ureteral stent placement.  SURGEON:  Jerilee Field, MD  TYPE OF ANESTHESIA:  General.  INDICATION FOR PROCEDURE:  Christy Cardenas is a 65 year old female.  She has a history of bladder cancer in 2007.  She had recurrence in 2011 of low- grade disease.  Her surveillance cystos had been normal up until this month where the mucosa on the right side of her bladder looks slightly abnormal, but no obvious tumors.  Pathology was obtained which was suspicious for malignancy, rule out low-grade neoplasia.  Discussed with the patient the nature, risks, benefits, and alternatives to cystoscopy, bladder biopsy, retrograde pyelograms, possible ureteroscopy, and stent placement.  All questions were answered and she elected to proceed.  We also discussed possible postoperative mitomycin-C instillation.  FINDINGS:  On exam under anesthesia, the patient had normal abdomen without masses, soft and nontender.  On bimanual exam, the urethra and the bladder were normal without palpable mass.  On cystoscopy, a 12- degree and 70-degree lens were used to completely evaluate the bladder. The only abnormality were overall the right bladder base.  The mucosa appeared rippled and appeared to be like a very early neoplastic process.  There was no areas of erythema.  The left biopsy site look well-healed with dyspnea vascularity.  There was no  abnormal mucosa.  LEFT RETROGRADE PYELOGRAM INTERPRETATION:  The left ureter had a circular filling defect in the distal ureter, possible stone or tumor. The catheter had been primed to get all the air out.  The remainder of the ureter, distal mid and proximal ureter appeared normal without dilation or filling defect.  There was also a normal left collecting system, calices, and renal pelvis.  LEFT URETEROSCOPY:  On left ureteroscopy, the left distal and mid ureter were confirmed to be normal without any tumor or stone.  INTERPRETATION OF RIGHT RETROGRADE PYELOGRAM:  Right retrograde pyelogram revealed 2 filling defects in the right distal ureter.  Again, the catheter had been primed and all air thought to be evacuated.  These filling defects did not appear to move.  There were 2 round filling defects consistent with either stone or tumor.  There is some slight dilation of the right distal ureter.  The mid and proximal ureter appeared normal.  The renal pelvis, mid and lower calices and infundibula appeared normal.  The right upper infundibulum never filled out with contrast and the right upper calyx appeared dilated with possible filling defect in the right upper infundibulum.  RIGHT URETEROSCOPY:  On ureteroscopy, the ureter distally mid and proximally was confirmed to be normal without stone or tumor.  The upper middle and lower calices were carefully inspected and  noted to be normal.  The right upper infundibulum was rather large, leading to a complex compound right upper calyx which was normal.  DESCRIPTION OF PROCEDURE:  After consent was obtained, the patient was brought to the operating room.  A time-out was performed to confirm the patient and procedure.  After adequate anesthesia, she was placed in lithotomy position and exam under anesthesia was performed.  She was then prepped and draped in the usual fashion.  The cystoscope was passed per urethra.  The bladder was  inspected in its entirety with a 12 and 70- degree lens with previous findings.  The only abnormalities on the right bladder base were then biopsied with cold cup x2 and sent to Pathology. These areas were then fulgurated as well as any abnormal mucosa around these areas.  Using a 6-French open-ended catheter, a left retrograde pyelogram was obtained.  The left retrograde injection of contrast after cannulating the left ureteral orifice with previous findings.  Given the findings on the retrograde, a sensor wire was advanced into the left ureter up into the left upper pole.  A semi rigid ureteroscope was then advanced without difficulty.  The intramural ureter was normal.  The left distal ureter was normal, as well as the mid ureter.  Given that the rest of the retrograde pyelogram was normal, I did not go above the iliacs on the left after confirming normal mid and distal ureter.  The wire was then removed from the left ureteral orifice.  Of note, after the biopsies, the bladder was copiously irrigated with water.  Using the rigid ureteroscope, the right ureteral orifice was cannulated with a sensor wire and it was coiled in the upper pole of the right kidney.  In a similar fashion, the ureteroscope was passed into the right side. Intramural ureter was normal.  The distal and mid ureter were normal. The rigid ureteroscope easily advanced over the iliacs, up into the proximal ureter and this appeared normal.  A 2nd wire was then advanced and coiled in the kidney through the ureteroscope and it was removed. Over the 2nd wire, a flexible ureteroscope was advanced into the renal pelvis.  The wire was removed.  Inspection of the upper, middle, and lower pole of the kidney revealed again normal system as well as a normal renal pelvis.  The ureter was carefully inspected on the way out and again confirmed to be normal.  Given bilateral procedure, the wire was back loaded on the cystoscope and a  6 x 24 cm right ureteral stent was placed.  The string was left on the stent.  The patient's bladder was drained and the scope was removed.  I did not give her mitomycin-C given the stent in place.  ESTIMATED BLOOD LOSS:  Minimal.  COMPLICATIONS:  None.  SPECIMENS:  Right bladder biopsy x2 sent to Pathology.  DISPOSITION:  The patient stable to PACU.          ______________________________ Jerilee Field, MD     ME/MEDQ  D:  07/31/2011  T:  07/31/2011  Job:  [id number]

## 2011-07-31 NOTE — Interval H&P Note (Signed)
History and Physical Interval Note:  07/31/2011 9:45 AM  Christy Cardenas  has presented today for surgery, with the diagnosis of Bladder Cancer  The various methods of treatment have been discussed with the patient and family. After consideration of risks, benefits and other options for treatment, the patient has consented to  Procedure(s): CYSTOSCOPY/RETROGRADE/URETEROSCOPY/BLADDER BIOPSY/FULGURATION/MITOMYCIN-C INSTILLATION CYSTOSCOPY WITH BIOPSY as a surgical intervention .  The patients' history has been reviewed, patient examined, no change in status, stable for surgery.  I have reviewed the patients' chart and labs.  Questions were answered to the patient's satisfaction.     Antony Haste

## 2011-08-02 ENCOUNTER — Encounter (HOSPITAL_COMMUNITY): Payer: Self-pay | Admitting: Urology

## 2012-01-23 ENCOUNTER — Emergency Department (HOSPITAL_COMMUNITY)
Admission: EM | Admit: 2012-01-23 | Discharge: 2012-01-23 | Disposition: A | Discharge status: Home / Self Care | Payer: BC Managed Care – PPO | Attending: Emergency Medicine | Admitting: Emergency Medicine

## 2012-01-23 ENCOUNTER — Encounter (HOSPITAL_COMMUNITY): Payer: Self-pay

## 2012-01-23 ENCOUNTER — Emergency Department (HOSPITAL_COMMUNITY): Payer: BC Managed Care – PPO

## 2012-01-23 DIAGNOSIS — Z8551 Personal history of malignant neoplasm of bladder: Secondary | ICD-10-CM | POA: Insufficient documentation

## 2012-01-23 DIAGNOSIS — F3289 Other specified depressive episodes: Secondary | ICD-10-CM | POA: Insufficient documentation

## 2012-01-23 DIAGNOSIS — M25569 Pain in unspecified knee: Secondary | ICD-10-CM | POA: Insufficient documentation

## 2012-01-23 DIAGNOSIS — F329 Major depressive disorder, single episode, unspecified: Secondary | ICD-10-CM | POA: Insufficient documentation

## 2012-01-23 DIAGNOSIS — Z79899 Other long term (current) drug therapy: Secondary | ICD-10-CM | POA: Insufficient documentation

## 2012-01-23 HISTORY — DX: Major depressive disorder, single episode, unspecified: F32.9

## 2012-01-23 HISTORY — DX: Depression, unspecified: F32.A

## 2012-01-23 MED ORDER — ONDANSETRON HCL 4 MG/2ML IJ SOLN
INTRAMUSCULAR | Status: AC
  Filled 2012-01-23: qty 2

## 2012-01-23 MED ORDER — HYDROCODONE-ACETAMINOPHEN 5-325 MG PO TABS: 2.0000 | ORAL_TABLET | ORAL | Status: AC | PRN

## 2012-01-23 MED ORDER — HYDROCODONE-ACETAMINOPHEN 5-325 MG PO TABS
1.0000 | ORAL_TABLET | Freq: Once | ORAL | Status: AC
  Administered 2012-01-23: 1 via ORAL
  Filled 2012-01-23: qty 1

## 2012-01-23 MED ORDER — HYDROCODONE-ACETAMINOPHEN 5-325 MG PO TABS: 2.0000 | ORAL_TABLET | ORAL | Status: DC | PRN

## 2012-01-23 NOTE — Progress Notes (Signed)
Orthopedic Tech Progress Note Patient Details:  Christy Cardenas [birth year] [id number]  Ortho Devices Type of Ortho Device: Crutches;Knee Immobilizer Ortho Device/Splint Location: (L) LE Ortho Device/Splint Interventions: Application   Asia R Thompson 01/23/2012, 7:23 PM

## 2012-01-23 NOTE — ED Notes (Signed)
IV in left hand was removed PT discharge

## 2012-01-23 NOTE — ED Notes (Signed)
Pt given ice pack on arrival

## 2012-01-23 NOTE — Discharge Instructions (Signed)
Follow up with your providers as dicussed in the ED today and as written above.  See your doctor immediately--or return to the ED--with any new or troubling symptoms including fevers, weakness, new chest pain, shortness or breath, numbness, or any other concerning symptom.    Knee Pain  The knee is the complex joint between your thigh and your lower leg. It is made up of bones, tendons, ligaments, and cartilage. The bones that make up the knee are:  The femur in the thigh.   The tibia and fibula in the lower leg.   The patella or kneecap riding in the groove on the lower femur.  CAUSES  Knee pain is a common complaint with many causes. A few of these causes are:  Injury, such as:   A ruptured ligament or tendon injury.   Torn cartilage.   Medical conditions, such as:   Gout   Arthritis   Infections   Overuse, over training or overdoing a physical activity.  Knee pain can be minor or severe. Knee pain can accompany debilitating injury. Minor knee problems often respond well to self-care measures or get well on their own. More serious injuries may need medical intervention or even surgery. SYMPTOMS The knee is complex. Symptoms of knee problems can vary widely. Some of the problems are:  Pain with movement and weight bearing.   Swelling and tenderness.   Buckling of the knee.   Inability to straighten or extend your knee.   Your knee locks and you cannot straighten it.   Warmth and redness with pain and fever.   Deformity or dislocation of the kneecap.  DIAGNOSIS  Determining what is wrong may be very straight forward such as when there is an injury. It can also be challenging because of the complexity of the knee. Tests to make a diagnosis may include:  Your caregiver taking a history and doing a physical exam.   Routine X-rays can be used to rule out other problems. X-rays will not reveal a cartilage tear. Some injuries of the knee can be diagnosed by:    Arthroscopy a surgical technique by which a small video camera is inserted through tiny incisions on the sides of the knee. This procedure is used to examine and repair internal knee joint problems. Tiny instruments can be used during arthroscopy to repair the torn knee cartilage (meniscus).   Arthrography is a radiology technique. A contrast liquid is directly injected into the knee joint. Internal structures of the knee joint then become visible on X-ray film.   An MRI scan is a non x-ray radiology procedure in which magnetic fields and a computer produce two- or three-dimensional images of the inside of the knee. Cartilage tears are often visible using an MRI scanner. MRI scans have largely replaced arthrography in diagnosing cartilage tears of the knee.   Blood work.   Examination of the fluid that helps to lubricate the knee joint (synovial fluid). This is done by taking a sample out using a needle and a syringe.  TREATMENT The treatment of knee problems depends on the cause. Some of these treatments are:  Depending on the injury, proper casting, splinting, surgery or physical therapy care will be needed.   Give yourself adequate recovery time. Do not overuse your joints. If you begin to get sore during workout routines, back off. Slow down or do fewer repetitions.   For repetitive activities such as cycling or running, maintain your strength and nutrition.   Alternate muscle  groups. For example if you are a weight lifter, work the upper body on one day and the lower body the next.   Either tight or weak muscles do not give the proper support for your knee. Tight or weak muscles do not absorb the stress placed on the knee joint. Keep the muscles surrounding the knee strong.   Take care of mechanical problems.   If you have flat feet, orthotics or special shoes may help. See your caregiver if you need help.   Arch supports, sometimes with wedges on the inner or outer aspect of the  heel, can help. These can shift pressure away from the side of the knee most bothered by osteoarthritis.   A brace called an "unloader" brace also may be used to help ease the pressure on the most arthritic side of the knee.   If your caregiver has prescribed crutches, braces, wraps or ice, use as directed. The acronym for this is PRICE. This means protection, rest, ice, compression and elevation.   Nonsteroidal anti-inflammatory drugs (NSAID's), can help relieve pain. But if taken immediately after an injury, they may actually increase swelling. Take NSAID's with food in your stomach. Stop them if you develop stomach problems. Do not take these if you have a history of ulcers, stomach pain or bleeding from the bowel. Do not take without your caregiver's approval if you have problems with fluid retention, heart failure, or kidney problems.   For ongoing knee problems, physical therapy may be helpful.   Glucosamine and chondroitin are over-the-counter dietary supplements. Both may help relieve the pain of osteoarthritis in the knee. These medicines are different from the usual anti-inflammatory drugs. Glucosamine may decrease the rate of cartilage destruction.   Injections of a corticosteroid drug into your knee joint may help reduce the symptoms of an arthritis flare-up. They may provide pain relief that lasts a few months. You may have to wait a few months between injections. The injections do have a small increased risk of infection, water retention and elevated blood sugar levels.   Hyaluronic acid injected into damaged joints may ease pain and provide lubrication. These injections may work by reducing inflammation. A series of shots may give relief for as long as 6 months.   Topical painkillers. Applying certain ointments to your skin may help relieve the pain and stiffness of osteoarthritis. Ask your pharmacist for suggestions. Many over the-counter products are approved for temporary relief of  arthritis pain.   In some countries, doctors often prescribe topical NSAID's for relief of chronic conditions such as arthritis and tendinitis. A review of treatment with NSAID creams found that they worked as well as oral medications but without the serious side effects.  PREVENTION  Maintain a healthy weight. Extra pounds put more strain on your joints.   Get strong, stay limber. Weak muscles are a common cause of knee injuries. Stretching is important. Include flexibility exercises in your workouts.   Be smart about exercise. If you have osteoarthritis, chronic knee pain or recurring injuries, you may need to change the way you exercise. This does not mean you have to stop being active. If your knees ache after jogging or playing basketball, consider switching to swimming, water aerobics or other low-impact activities, at least for a few days a week. Sometimes limiting high-impact activities will provide relief.   Make sure your shoes fit well. Choose footwear that is right for your sport.   Protect your knees. Use the proper gear for knee-sensitive  activities. Use kneepads when playing volleyball or laying carpet. Buckle your seat belt every time you drive. Most shattered kneecaps occur in car accidents.   Rest when you are tired.  SEEK MEDICAL CARE IF:  You have knee pain that is continual and does not seem to be getting better.  SEEK IMMEDIATE MEDICAL CARE IF:  Your knee joint feels hot to the touch and you have a high fever. MAKE SURE YOU:   Understand these instructions.   Will watch your condition.   Will get help right away if you are not doing well or get worse.  Document Released: 05/27/2007 Document Revised: 07/19/2011 Document Reviewed: 05/27/2007 [facility] Patient Information 2012 [place], [place].

## 2012-01-23 NOTE — ED Provider Notes (Signed)
History     CSN: [id number]  Arrival date & time 01/23/12  1643   First MD Initiated Contact with Patient 01/23/12 1701      Chief Complaint  Patient presents with  . Knee Pain    (Consider location/radiation/quality/duration/timing/severity/associated sxs/prior treatment) HPI  66 year old female past medical history of left knee dislocation 4 years ago presenting with a two-week history of left-sided knee pain. First the pain was sharp anterior subpatellar pain, only painful when walking.  Now hurts all the time with the 6/10 subpatellar pain, as well as a more sharp pain in that location suprapatellar for the last several days which is 8/10. It hurts worse to stand on it. She denies that ranging the knee causes increased pain. He endorses swelling but this is chronic swelling for greater than 10 years. She denies any recent fevers, chills, nausea, vomiting, diarrhea. Had any recent trauma as well. She denies any history of gout or infected joints. Her vital signs are normal on arrival.  Past Medical History  Diagnosis Date  . Bladder cancer   . Depression     Past Surgical History  Procedure Date  . Right ankle fracture 03/2011    plates and screws-at [facility]  . Tonsillectomy     as child  . Cystoscopy/retrograde/ureteroscopy 07/31/2011    Procedure: CYSTOSCOPY/RETROGRADE/URETEROSCOPY;  Surgeon: Antony Haste, MD;  Location: [facility];  Service: Urology;  Laterality: Bilateral;  CYSTOSCOPY BILATERAL RETROGRADE PYELOGRAM URETEROSCOPY Right ureteral stent placement  [facility]   . Cystoscopy with biopsy 07/31/2011    Procedure: CYSTOSCOPY WITH BIOPSY;  Surgeon: Antony Haste, MD;  Location: [facility];  Service: Urology;  Laterality: N/A;  BLADDER BIOPSY    No family history on file.  History  Substance Use Topics  . Smoking status: Never Smoker   . Smokeless tobacco: Not on file  . Alcohol Use: Yes     socially    OB History    Grav Para Term  Preterm Abortions TAB SAB Ect Mult Living                  Review of Systems Constitutional: Negative for fever and chills.  HENT: Negative for ear pain, sore throat and trouble swallowing.   Eyes: Negative for pain and visual disturbance.  Respiratory: Negative for cough and shortness of breath.   Cardiovascular: Negative for chest pain and leg swelling.  Gastrointestinal: Negative for nausea, vomiting, abdominal pain and diarrhea.  Genitourinary: Negative for dysuria, urgency and frequency.  Musculoskeletal: Negative for back pain, chronic L knee swelling.  Skin: Negative for rash and wound.  Neurological: Negative for dizziness, syncope, speech difficulty, weakness and numbness.    Allergies  Review of patient's allergies indicates no known allergies.  Home Medications   Current Outpatient Rx  Name Route Sig Dispense Refill  . ACETAMINOPHEN 325 MG PO TABS Oral Take 975 mg by mouth once.    Marland Kitchen CALCIUM 500 +D PO Oral Take 1 tablet by mouth 2 (two) times daily.     Marland Kitchen ESCITALOPRAM OXALATE 10 MG PO TABS Oral Take 10 mg by mouth at bedtime.     . ADULT MULTIVITAMIN W/MINERALS CH Oral Take 1 tablet by mouth daily.    Marland Kitchen NAPROXEN SODIUM 220 MG PO TABS Oral Take 220 mg by mouth 2 (two) times daily as needed. For pain    . HYDROCODONE-ACETAMINOPHEN 5-325 MG PO TABS Oral Take 2 tablets by mouth every 4 (four) hours as needed for pain. Use  this medication for breakthrough pain. Continue taking your naproxen. Do not take Tylenol concurrently with this medication 15 tablet 0    BP 116/70  Pulse 77  Temp 97 F (36.1 C) (Oral)  Resp 18  SpO2 100%  Physical Exam Consitutional: Pt in no acute distress.   Head: Normocephalic and atraumatic.  Eyes: Extraocular motion intact, no scleral icterus Neck: Supple without meningismus, mass, or overt JVD Respiratory: Effort normal and breath sounds normal. No respiratory distress. CV: Heart regular rate and rhythm, no obvious murmurs.  Pulses +2  and symmetric Abdomen: Soft, non-tender, non-distended MSK: Left knee is not warm, and it has no abnormal color. There is no erythema appreciated. There is no medial tenderness to palpation along the joint line.  The lateral knee is mildly tender to palpation along the joint line.  The anterior tibial plateau is tender to palpation, and the patient is tender to palpation suprapatellar region over the soft tissues. Regarding the right knee does elicit a very small subtle painful click of the patella at about 20.   Patellar compression elicits only mild discomfort. There is moderate swelling about the knee which the patient maintains his chronic.  Mild blotting with patellar compression.  No lateral instability, or anterior or posterior instability. Otherwise the extremities are atraumatic without deformity, ROM intact Skin: Warm, dry, intact Neuro: Alert and oriented, no motor deficit noted.  CNs intact.  CNs 2-12 normal.  Psychiatric: Mood and affect are normal      ED Course  Procedures (including critical care time)  Labs Reviewed - No data to display Dg Knee Complete 4 Views Left  01/23/2012  *RADIOLOGY REPORT*  Clinical Data: Pain.  No specific injury.  LEFT KNEE - COMPLETE 4+ VIEW  Comparison: None.  Findings: Patellofemoral joint degenerative changes. Mild lateral tibiofemoral joint degenerative changes with osteophyte.  No fracture or dislocation.  IMPRESSION: Mild degenerative changes patellofemoral joint and lateral tibiofemoral joint space.  Original Report Authenticated By: Fuller Canada, M.D.     1. Knee pain       MDM  There is no pain ranging this joint, this is not consistent with joint infection or gout. Her greatest tenderness is above the patella and the soft tissue. Weber her extension is intact. Very the possibility of suprapatellar tendon injury, although she has little or no mechanism for this.  The swelling would suggest arthritis however this is chronic and she  does not typically have left knee pain.  He also therefore remains unclear at this time, we'll shoot x-ray of the left knee. Patient has followup in 6 days with orthopedist. Assuming no outstanding injury, will discharge him to followup with orthopedics. Imaging demonstrates arthritis. No fracture or lesions. Pain well-controlled here. Discharged home to followup with orthopedics.  PT DC home stable.  Discussed with pt the clinical impression, treatment in the ED, and follow up plan.  We alslo discussed the indications for returning to the ED, which include shortness or breath, confusion, fever, new weakness or numbness, chest pain, or any other concerning symptom.  The pt understood the treatment and plan, is stable, and is able to leave the ED.          Larrie Kass, MD 01/24/12 [phone]

## 2012-01-23 NOTE — ED Notes (Signed)
Pt was brought in by ambulance with complaint of knee pain and swelling that got worse today. Pt was given 250 mcg of fentanyl and 4 mg of Zofran IV. Pt stated that she has chronic pain to the lt knee.

## 2012-01-25 NOTE — ED Provider Notes (Signed)
I saw and evaluated the patient, reviewed the resident's note and I agree with the findings and plan. Patient knee pain. Severe pain. Painful with walking.  Juliet Rude. Rubin Payor, MD 01/25/12 [phone]

## 2012-07-31 ENCOUNTER — Other Ambulatory Visit: Payer: Self-pay | Admitting: Urology

## 2012-08-11 ENCOUNTER — Encounter (HOSPITAL_BASED_OUTPATIENT_CLINIC_OR_DEPARTMENT_OTHER): Payer: Self-pay | Admitting: *Deleted

## 2012-08-11 NOTE — H&P (Signed)
History of Present Illness                            F/u bladder cancer.  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  -- Nov 2011 TURBT, Bilat RGP and post-op [facility] for a 2cm left lateral tumor. RGP's were negative. 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  -- Jan 2012 completed BCG second course -- Apr 2012 negative cystoscopy -- Sept 2012 negative cystocopy. She did not want to start maintenance BCG. --Dec 2012 - subtle mucosal abnl on right. Cysto/bbx/MMC/bilat RGP and URS, right stent  - small LG Ta with chronic inflammation, upper tracts normal. --Jan 2013 completed BCG maintenance  --Apr 2013 normal cystoscopy --Aug 2013 BCG maintenance completed   Interval Hx She is here for surveillance cystoscopy. She has been having frequency, urgency, nocturia and bladder pressure. She started Vesicare 10 mg which has improved her symptoms. She's still having bladder pain. She saw her GYN and she was told she had a UTI. She took 10 days of Cipro. She was told she had rbc's in the urine. She has pain in the bladder area like "knives". Her PAP smear was normal. She is also on Uribel and took some of her husbands pain medicine.     Past Medical History Problems  1. History of  Bladder Cancer V10.51 2. History of  Bladder Cancer V10.51 3. History of  Urinary Tract Infection V13.02  Surgical History Problems  1. History of  Bladder Surgery 2. History of  Cystoscopy With Fulguration Medium Lesion (2-5cm) 3. History of  Cystoscopy With Fulguration Minor Lesion (Under 5mm) 4. History of  Cystoscopy With Insertion Of Ureteral Stent Right 5. History of  Cystoscopy With Ureteroscopy Bilateral  Current Meds 1. Calcium TABS; Therapy: (Recorded:21Apr2008) to 2. Escitalopram Oxalate 10 MG Oral Tablet; TAKE 1 TABLET BY MOUTH EVERY DAY; Therapy:  05Apr2012 to 3. Multi-Vitamin TABS; Therapy: (Recorded:21Apr2008) to 4.  Premarin 0.625 MG/GM Vaginal Cream; INSERT 1 APPLICATORFUL(1-2 GRAMS) 3 TIMES  WEEKLY; Therapy: 19Apr2012 to 5. Uribel 118 MG Oral Capsule; TAKE 1 CAPSULE 4 times daily; Therapy: 15Nov2013 to  (Evaluate:13Feb2014)  Requested for: 15Nov2013; Last Rx:15Nov2013 6. VESIcare 5 MG Oral Tablet; Take 1 tablet daily; Therapy: 16Oct2013 to (Evaluate:11Oct2014)   Requested for: 17Oct2013; Last Rx:16Oct2013  Allergies Medication  1. Pyridium TABS  Family History Problems  1. Paternal history of  Family Health Status Number Of Children 1-SON 1-DAUGHTER 2. Paternal history of  Leukemia V16.6 3. Maternal history of  Nephrolithiasis  Social History Problems  1. Alcohol Use Socially 2. Caffeine Use 2 3. Paternal history of  Death In The Family Father 4. Marital History - Currently Married 5. Never A Smoker 6. Occupation: Retired Financial planner Vital Signs [Data Includes: Last 1 Day]  17Dec2013 04:26PM  Blood Pressure: 120 / 83 Temperature: 98.2 F Heart Rate: 97  Physical Exam Constitutional: Well nourished and well developed . No acute distress.  ENT:. The ears and nose are normal in appearance.  Neck: The appearance of the neck is normal and no neck mass is present.  Pulmonary: No respiratory distress and normal respiratory rhythm and effort.  Cardiovascular: Heart rate and rhythm are normal . No peripheral edema.  Abdomen: The abdomen is soft and nontender. No masses are palpated. No CVA tenderness. No hernias are palpable. No hepatosplenomegaly noted.  Genitourinary:  Chaperone Present: .  Examination of the external genitalia shows normal female external genitalia and no lesions. The urethra is normal in appearance and not tender. There is no urethral mass. Vaginal exam demonstrates atrophy, but no abnormalities. The bladder is non tender, not distended and without masses. The anus is normal on inspection. The perineum is normal on inspection.  Lymphatics: The femoral and inguinal  nodes are not enlarged or tender.  Skin: Normal skin turgor, no visible rash and no visible skin lesions.  Neuro/Psych:. Mood and affect are appropriate.    Results/Data Urine [Data Includes: Last 1 Day]   17Dec2013  COLOR GREEN   APPEARANCE CLEAR   SPECIFIC GRAVITY 1.025   pH 6.5   GLUCOSE NEG mg/dL  BILIRUBIN NEG   KETONE TRACE mg/dL  BLOOD SMALL   PROTEIN TRACE mg/dL  UROBILINOGEN 0.2 mg/dL  NITRITE NEG   LEUKOCYTE ESTERASE NEG   SQUAMOUS EPITHELIAL/HPF MANY   WBC 0-2 WBC/hpf  RBC 11-20 RBC/hpf  BACTERIA FEW   CRYSTALS NONE SEEN   CASTS NONE SEEN   Other MUCUS NOTED    Procedure  Procedure: Cystoscopy  Chaperone Present: erica.  Indication: History of Urothelial Carcinoma.  Informed Consent: Risks, benefits, and potential adverse events were discussed and informed consent was obtained from the patient.  Prep: The patient was prepped with betadine.  Antibiotic prophylaxis: Ciprofloxacin.  Procedure Note:  Urethral meatus:. No abnormalities.  Anterior urethra: No abnormalities.  Bladder: Visulization was clear. The ureteral orifices were in the normal anatomic position bilaterally and had clear efflux of urine. A systematic survey of the bladder demonstrated no bladder tumors or stones. Examination of the bladder demonstrated an ulcer located on the left side, near the dome of the bladder. The patient tolerated the procedure well.  Complications: None.    Assessment Assessed  1. Bladder Cancer 188.9 2. Bladder Pain 788.99  Plan Bladder Cancer (188.9), Bladder Pain (788.99)  1. Hydrocodone-Acetaminophen 5-325 MG Oral Tablet; TAKE 1 TO 2 TABLETS EVERY 4 TO 6  HOURS AS NEEDED FOR PAIN; Therapy: 17Dec2013 to (Evaluate:16Jan2014); Last  Rx:17Dec2013 2. Follow-up Schedule Surgery Office  Follow-up  Requested for: 17Dec2013 Health Maintenance (V70.0)  3. UA With REFLEX  Done: 17Dec2013 04:02PM Microscopic Hematuria (599.72)  4. URINE CULTURE  Done:  17Dec2013  Discussion/Summary     We discussed the cystoscopic findings. The ulcer at the dome of the bladder could certainly explain her pain and hematuria. This needs to be biopsied and fulgurated. We discussed the nature, R/B/A to cysto, bladder biopsy and fulguration. All questions answered and she elects to proceed.    Signatures Electronically signed by : Jerilee Field, M.D.; Jul 29 2012  5:12PM

## 2012-08-11 NOTE — Progress Notes (Signed)
Pt instructed npo p mn 1/2 x lexapro w sip of water. To [facility] 1/3 @ 0845.  Needs hgb, ekg on arrival. Pt aware to bring contact case and solution DOS

## 2012-08-15 ENCOUNTER — Other Ambulatory Visit: Payer: Self-pay

## 2012-08-15 ENCOUNTER — Ambulatory Visit (HOSPITAL_BASED_OUTPATIENT_CLINIC_OR_DEPARTMENT_OTHER): Payer: Medicare PPO | Admitting: Anesthesiology

## 2012-08-15 ENCOUNTER — Encounter (HOSPITAL_BASED_OUTPATIENT_CLINIC_OR_DEPARTMENT_OTHER): Payer: Self-pay | Admitting: *Deleted

## 2012-08-15 ENCOUNTER — Encounter (HOSPITAL_BASED_OUTPATIENT_CLINIC_OR_DEPARTMENT_OTHER): Payer: Self-pay | Admitting: Anesthesiology

## 2012-08-15 ENCOUNTER — Ambulatory Visit (HOSPITAL_BASED_OUTPATIENT_CLINIC_OR_DEPARTMENT_OTHER)
Admission: RE | Admit: 2012-08-15 | Discharge: 2012-08-15 | Disposition: A | Discharge status: Home / Self Care | Payer: Medicare PPO | Source: Ambulatory Visit | Attending: Urology | Admitting: Urology

## 2012-08-15 ENCOUNTER — Encounter (HOSPITAL_BASED_OUTPATIENT_CLINIC_OR_DEPARTMENT_OTHER)
Admission: RE | Disposition: A | Discharge status: Home / Self Care | Payer: Self-pay | Source: Ambulatory Visit | Attending: Urology

## 2012-08-15 DIAGNOSIS — N329 Bladder disorder, unspecified: Secondary | ICD-10-CM | POA: Insufficient documentation

## 2012-08-15 DIAGNOSIS — Z8551 Personal history of malignant neoplasm of bladder: Secondary | ICD-10-CM | POA: Insufficient documentation

## 2012-08-15 DIAGNOSIS — R3989 Other symptoms and signs involving the genitourinary system: Secondary | ICD-10-CM | POA: Insufficient documentation

## 2012-08-15 DIAGNOSIS — Z01810 Encounter for preprocedural cardiovascular examination: Secondary | ICD-10-CM | POA: Insufficient documentation

## 2012-08-15 HISTORY — PX: CYSTOSCOPY WITH BIOPSY: SHX5122

## 2012-08-15 SURGERY — CYSTOSCOPY, WITH BIOPSY
Anesthesia: General | Site: Bladder | Wound class: Clean Contaminated

## 2012-08-15 MED ORDER — CEFAZOLIN SODIUM-DEXTROSE 2-3 GM-% IV SOLR
INTRAVENOUS | Status: DC | PRN
  Administered 2012-08-15: 2 g via INTRAVENOUS

## 2012-08-15 MED ORDER — CEFAZOLIN SODIUM-DEXTROSE 2-3 GM-% IV SOLR
2.0000 g | INTRAVENOUS | Status: DC
  Filled 2012-08-15: qty 50

## 2012-08-15 MED ORDER — KETOROLAC TROMETHAMINE 30 MG/ML IJ SOLN
INTRAMUSCULAR | Status: DC | PRN
  Administered 2012-08-15: 30 mg via INTRAVENOUS

## 2012-08-15 MED ORDER — LACTATED RINGERS IV SOLN
INTRAVENOUS | Status: DC | PRN
  Administered 2012-08-15: 09:00:00 via INTRAVENOUS

## 2012-08-15 MED ORDER — CEFAZOLIN SODIUM 1-5 GM-% IV SOLN
1.0000 g | INTRAVENOUS | Status: DC
  Filled 2012-08-15: qty 50

## 2012-08-15 MED ORDER — PROPOFOL 10 MG/ML IV BOLUS
INTRAVENOUS | Status: DC | PRN
  Administered 2012-08-15: 200 mg via INTRAVENOUS

## 2012-08-15 MED ORDER — ONDANSETRON HCL 4 MG/2ML IJ SOLN
INTRAMUSCULAR | Status: DC | PRN
  Administered 2012-08-15: 4 mg via INTRAVENOUS

## 2012-08-15 MED ORDER — FENTANYL CITRATE 0.05 MG/ML IJ SOLN
INTRAMUSCULAR | Status: DC | PRN
  Administered 2012-08-15: 25 ug via INTRAVENOUS
  Administered 2012-08-15 (×2): 50 ug via INTRAVENOUS
  Administered 2012-08-15: 25 ug via INTRAVENOUS

## 2012-08-15 MED ORDER — LIDOCAINE HCL (CARDIAC) 20 MG/ML IV SOLN
INTRAVENOUS | Status: DC | PRN
  Administered 2012-08-15: 100 mg via INTRAVENOUS

## 2012-08-15 MED ORDER — MIDAZOLAM HCL 5 MG/5ML IJ SOLN
INTRAMUSCULAR | Status: DC | PRN
  Administered 2012-08-15: 2 mg via INTRAVENOUS

## 2012-08-15 MED ORDER — STERILE WATER FOR IRRIGATION IR SOLN
Status: DC | PRN
  Administered 2012-08-15: 3000 mL

## 2012-08-15 MED ORDER — DEXAMETHASONE SODIUM PHOSPHATE 4 MG/ML IJ SOLN
INTRAMUSCULAR | Status: DC | PRN
  Administered 2012-08-15: 10 mg via INTRAVENOUS

## 2012-08-15 MED ORDER — LACTATED RINGERS IV SOLN
INTRAVENOUS | Status: DC
  Administered 2012-08-15: 09:00:00 via INTRAVENOUS
  Filled 2012-08-15: qty 1000

## 2012-08-15 SURGICAL SUPPLY — 15 items
BAG DRAIN URO-CYSTO SKYTR STRL (DRAIN) ×2 IMPLANT
CANISTER SUCT LVC 12 LTR MEDI- (MISCELLANEOUS) ×2 IMPLANT
CLOTH BEACON ORANGE TIMEOUT ST (SAFETY) ×2 IMPLANT
DRAPE CAMERA CLOSED 9X96 (DRAPES) ×2 IMPLANT
ELECT REM PT RETURN 9FT ADLT (ELECTROSURGICAL) ×2
ELECTRODE REM PT RTRN 9FT ADLT (ELECTROSURGICAL) ×1 IMPLANT
GLOVE BIO SURGEON STRL SZ 6.5 (GLOVE) ×2 IMPLANT
GLOVE BIO SURGEON STRL SZ7.5 (GLOVE) ×2 IMPLANT
GLOVE ECLIPSE 6.0 STRL STRAW (GLOVE) ×2 IMPLANT
GOWN STRL NON-REIN LRG LVL3 (GOWN DISPOSABLE) ×2 IMPLANT
GOWN STRL REIN XL XLG (GOWN DISPOSABLE) ×2 IMPLANT
NEEDLE HYPO 22GX1.5 SAFETY (NEEDLE) IMPLANT
NS IRRIG 500ML POUR BTL (IV SOLUTION) IMPLANT
PACK CYSTOSCOPY (CUSTOM PROCEDURE TRAY) ×2 IMPLANT
WATER STERILE IRR 3000ML UROMA (IV SOLUTION) ×2 IMPLANT

## 2012-08-15 NOTE — Anesthesia Procedure Notes (Signed)
Procedure Name: LMA Insertion Date/Time: 08/15/2012 9:49 AM Performed by: Maris Berger T Pre-anesthesia Checklist: Patient identified, Emergency Drugs available, Suction available and Patient being monitored Patient Re-evaluated:Patient Re-evaluated prior to inductionOxygen Delivery Method: Circle System Utilized Preoxygenation: Pre-oxygenation with 100% oxygen Intubation Type: IV induction Ventilation: Mask ventilation without difficulty LMA: LMA inserted LMA Size: 4.0 Number of attempts: 1 Airway Equipment and Method: bite block Placement Confirmation: positive ETCO2 Tube secured with: Tape Dental Injury: Teeth and Oropharynx as per pre-operative assessment

## 2012-08-15 NOTE — Anesthesia Postprocedure Evaluation (Signed)
Anesthesia Post Note  Patient: Christy Cardenas  Procedure(s) Performed: Procedure(s) (LRB): CYSTOSCOPY WITH BIOPSY (N/A)  Anesthesia type: General  Patient location: PACU  Post pain: Pain level controlled  Post assessment: Post-op Vital signs reviewed  Last Vitals:  Filed Vitals:   08/15/12 1045  BP: 134/90  Pulse: 73  Temp:   Resp: 10    Post vital signs: Reviewed  Level of consciousness: sedated  Complications: No apparent anesthesia complications

## 2012-08-15 NOTE — Transfer of Care (Signed)
Immediate Anesthesia Transfer of Care Note  Patient: Christy Cardenas  Procedure(s) Performed: Procedure(s) (LRB): CYSTOSCOPY WITH BIOPSY (N/A)  Patient Location: PACU  Anesthesia Type: General  Level of Consciousness: awake, sedated, patient cooperative and responds to stimulation  Airway & Oxygen Therapy: Patient Spontanous Breathing and Patient connected to Nasal Cannula oxygen Pre-op Assessment: Report given to PACU RN, Post -op Vital signs reviewed and stable and Patient moving all extremities  Post vital signs: Reviewed and stable  Complications: No apparent anesthesia complications

## 2012-08-15 NOTE — Op Note (Signed)
Preoperative diagnosis: Bladder cancer Postoperative diagnosis: Bladder cancer  Procedure: Exam under anesthesia Cystoscopy Bladder biopsy and fulguration  Surgeon: Mena Goes  Anesthesia: Gen.  Findings: On exam under anesthesia the urethra and bladder were palpably normal on bimanual exam and there were no suprapubic or abdominal masses palpated.  On cystoscopy the urethra was normal the trigone and ureteral orifice these were normal and in their orthotopic position. There was clear reflux of urine bilaterally. Prior scar on the left bladder lateral to the UO had some neovascularity and this was biopsied. On the posterior bladder toward the dome there was a velvety erythematous patch of mucosa this was biopsied x2.  Description of procedure: After consent was obtained patient brought the operating room. After adequate anesthesia she was placed in lithotomy position and prepped and draped in the usual fashion. A timeout was performed to confirm the patient and procedure. An exam under anesthesia was performed. The cystoscope was passed per urethra and the bladder examined with a 12 and 70 lenses. The rigid biopsy forceps were used to biopsy the left bladder and next the erythematous patch on the dome x2. The areas were fulgurated with excellent hemostasis. The bladder was drained she was awakened taken to recovery room in stable condition.   Specimens: #1 left bladder #2 posterior bladder (this was the area in question)  Blood loss: Minimal  Complications: None  Drains: None  Disposition: Patient stable to PACU

## 2012-08-15 NOTE — Interval H&P Note (Signed)
History and Physical Interval Note:  08/15/2012 9:16 AM  Christy Cardenas  has presented today for surgery, with the diagnosis of Bladder Cancer  The various methods of treatment have been discussed with the patient and family. After consideration of risks, benefits and other options for treatment, the patient has consented to  Procedure(s) (LRB) with comments: CYSTOSCOPY WITH BIOPSY (N/A) - FULGURATION   as a surgical intervention .  The patient's history has been reviewed, patient examined, no change in status, stable for surgery.  I have reviewed the patient's chart and labs.  Questions were answered to the patient's satisfaction.     Antony Haste

## 2012-08-15 NOTE — Anesthesia Preprocedure Evaluation (Addendum)
Anesthesia Evaluation  Patient identified by MRN, date of birth, ID band Patient awake    Reviewed: Allergy & Precautions, H&P , NPO status , Patient's Chart, lab work & pertinent test results  Airway Mallampati: II TM Distance: >3 FB Neck ROM: full    Dental No notable dental hx. (+) Teeth Intact and Dental Advisory Given   Pulmonary neg pulmonary ROS,  breath sounds clear to auscultation  Pulmonary exam normal       Cardiovascular Exercise Tolerance: Good negative cardio ROS  Rhythm:regular Rate:Normal     Neuro/Psych negative neurological ROS  negative psych ROS   GI/Hepatic negative GI ROS, Neg liver ROS,   Endo/Other  negative endocrine ROS  Renal/GU Neoplasm bladder  negative genitourinary   Musculoskeletal   Abdominal   Peds  Hematology negative hematology ROS (+)   Anesthesia Other Findings   Reproductive/Obstetrics negative OB ROS                          Anesthesia Physical Anesthesia Plan  ASA: II  Anesthesia Plan: General   Post-op Pain Management:    Induction: Intravenous  Airway Management Planned: LMA  Additional Equipment:   Intra-op Plan:   Post-operative Plan: Extubation in OR  Informed Consent: I have reviewed the patients History and Physical, chart, labs and discussed the procedure including the risks, benefits and alternatives for the proposed anesthesia with the patient or authorized representative who has indicated his/her understanding and acceptance.   Dental advisory given  Plan Discussed with: CRNA  Anesthesia Plan Comments:        Anesthesia Quick Evaluation

## 2012-08-18 ENCOUNTER — Encounter (HOSPITAL_BASED_OUTPATIENT_CLINIC_OR_DEPARTMENT_OTHER): Payer: Self-pay | Admitting: Urology

## 2012-08-20 LAB — POCT HEMOGLOBIN-HEMACUE: Hemoglobin: 12.7 g/dL (ref 12.0–15.0)

## 2014-08-13 HISTORY — PX: CATARACT EXTRACTION W/ INTRAOCULAR LENS  IMPLANT, BILATERAL: SHX1307

## 2014-08-13 HISTORY — PX: EYE SURGERY: SHX253

## 2014-08-13 HISTORY — PX: CATARACT EXTRACTION: SUR2

## 2015-03-10 DIAGNOSIS — F32A Depression, unspecified: Secondary | ICD-10-CM | POA: Insufficient documentation

## 2015-03-10 DIAGNOSIS — E7800 Pure hypercholesterolemia, unspecified: Secondary | ICD-10-CM | POA: Insufficient documentation

## 2015-03-10 DIAGNOSIS — F329 Major depressive disorder, single episode, unspecified: Secondary | ICD-10-CM | POA: Insufficient documentation

## 2015-06-27 DIAGNOSIS — H9191 Unspecified hearing loss, right ear: Secondary | ICD-10-CM | POA: Insufficient documentation

## 2015-06-27 DIAGNOSIS — I10 Essential (primary) hypertension: Secondary | ICD-10-CM | POA: Insufficient documentation

## 2015-07-21 ENCOUNTER — Other Ambulatory Visit: Payer: Self-pay | Admitting: Urology

## 2015-07-21 ENCOUNTER — Encounter (HOSPITAL_BASED_OUTPATIENT_CLINIC_OR_DEPARTMENT_OTHER): Payer: Self-pay | Admitting: *Deleted

## 2015-07-21 NOTE — H&P (Signed)
History of Present Illness    F/u -    1-bladder cancer -  -- 2007 LG Ta, three small right lateral lesions and underwent BCG x 6.  -- Nov 2011 LG Ta left lateral (low grade, non-invasive, muscle in specimen ) s/p TURBT, Bilat RGP and post-op [facility].  --Dec 2012 - small LG Ta with chronic inflammation bx of subtle mucosal abnl on right. Cysto/bbx/[facility]/bilat RGP and URS, right stent. Upper tracts normal. --Jan 2014 - bladder bx in OR benign inflammation and granulation tissue - ulcer at dome  Last upper tract: Dec 2012 - RGP Last BCG: Sept 2013 maintenance - held due to an ulcer/granulation slow to heal at dome.  Last cytology: Nov 2015 benign - erythema at right, left and dome improving - prior cytology atypical.  Last cystoscopy: Nov 2015    2- Frequency- Vesicare 10 mg for frequency in 2013.  -Oct 2014 - she self increased the VESIcare to 20 mg. This is controlling her symptoms. -Apr 2015 on Toviaz     Nov 2016 int hx   Patient returns for continued management of overactive bladder an bladder cancer. She's been well. Her frequency and urgency are well-controlled on transplant. She is in no dysuria or gross hematuria. No flank pain.    Past Medical History Problems  1. Personal history of bladder cancer (Z85.51) 2. Personal history of bladder cancer (Z85.51) 3. History of Urinary Tract Infection  Surgical History Problems  1. History of Bladder Surgery 2. History of Cystoscopy With Fulguration Medium Lesion (2-5cm) 3. History of Cystoscopy With Fulguration Minor Lesion (Under 16mm) 4. History of Cystoscopy With Fulguration Minor Lesion (Under 25mm) 5. History of Cystoscopy With Insertion Of Ureteral Stent Right 6. History of Cystoscopy With Ureteroscopy Bilateral  Current Meds 1. AmLODIPine Besylate 2.5 MG Oral Tablet;  Therapy: [id number] to Recorded 2. Calcium TABS;  Therapy: (Recorded:21Apr2008) to Recorded 3. Escitalopram Oxalate 10 MG Oral Tablet; TAKE 1 TABLET BY  MOUTH EVERY DAY;  Therapy: 05Apr2012 to Recorded 4. Multi-Vitamin TABS;  Therapy: (Recorded:21Apr2008) to Recorded 5. Premarin 0.625 MG/GM Vaginal Cream; INSERT 1 APPLICATORFUL(1-2 GRAMS) 3  TIMES WEEKLY;  Therapy: 19Apr2012 to Recorded 6. Trospium Chloride ER 60 MG Oral Capsule Extended Release 24 Hour; TAKE 1  CAPSULE BY MOUTH DAILY;  Therapy: [id number] to (Evaluate:18Nov2016)  Requested for: [id number]; Last  Rx:24Nov2015 Ordered  Allergies Medication  1. Pyridium TABS  Family History Problems  1. Family history of Family Health Status Number Of Children : Father   1-SON 1-DAUGHTER 2. Family history of Leukemia : Father 3. Family history of Nephrolithiasis : Mother  Social History Problems  1. Alcohol Use (History)   Socially 2. Caffeine Use   2 3. Family history of Death In The Family Father 4. Marital History - Currently Married 5. Never A Smoker 6. Occupation:   Retired Recruitment consultant Vital Signs [Data Includes: Last 1 Day]  Recorded: [id number] 02:09PM  Weight: 182 lb  BMI Calculated: 29.83 BSA Calculated: 1.91 Blood Pressure: 142 / 94 Heart Rate: 88  Physical Exam Abdomen: The abdomen is soft and nontender. No masses are palpated. No CVA tenderness. No hernias are palpable. No hepatosplenomegaly noted.  Genitourinary:  Chaperone Present: .  Examination of the external genitalia shows normal female external genitalia and no lesions. The urethra is normal in appearance and not tender. There is no urethral mass. Vaginal exam demonstrates no abnormalities. The bladder is non tender, not distended and without masses. The anus is normal on inspection. The  perineum is normal on inspection.    Results/Data Urine [Data Includes: Last 1 Day]   [id number]  COLOR AMBER   APPEARANCE CLEAR   SPECIFIC GRAVITY 1.025   pH 6.0   GLUCOSE NEGATIVE   BILIRUBIN NEGATIVE   KETONE NEGATIVE   BLOOD NEGATIVE   PROTEIN NEGATIVE   NITRITE NEGATIVE   LEUKOCYTE ESTERASE  NEGATIVE    Procedure  Procedure: Cystoscopy   Informed Consent: Risks, benefits, and potential adverse events were discussed and informed consent was obtained from the patient.  Prep: The patient was prepped with betadine.  Antibiotic prophylaxis: Ciprofloxacin.  Procedure Note:  Urethral meatus:. No abnormalities.  Anterior urethra: No abnormalities.  Bladder: Visulization was clear. The ureteral orifices were in the normal anatomic position bilaterally and had clear efflux of urine. A systematic survey of the bladder demonstrated no bladder tumors or stones. The mucosa was smooth without abnormalities. Examination of the bladder demonstrated erythematous mucosa. Some scattered on the right, on the left and at the dome. The patient tolerated the procedure well.  Complications: None.    Assessment Assessed  1. Urinary urgency (R39.15) 2. 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 UA With REFLEX; [Do Not Release]; Status:Complete;   [id number] 01:47PM Malignant neoplasm of lateral wall of urinary bladder  2. Cysto; Status:Complete;   [id number] 02:27PM 3. Cysto; Status:Hold For - Appointment,Date of Service; Requested for:29Nov2016;  4. URINE CYTOLOGY; Status:Hold For - Specimen/Data Collection,Appointment;  Requested for:29Nov2016;  5. Follow-up Year x 1 Office  Follow-up  Status: Hold For - Appointment,Date of Service   Requested for: [phone] Urinary urgency  6. Renew: Trospium Chloride ER 60 MG Oral Capsule Extended Release 24 Hour; TAKE 1  CAPSULE BY MOUTH DAILY  Discussion/Summary       Bladder Ca - no obvious evidence of disease on cystoscopy today. Certainly no papillary formations. Send urine cytology again. Continue yearly cystoscopy.   Urinary frequency -  stable. refilled trospium.     Signatures Electronically signed by : Festus Aloe, M.D.; Jul 12 2015  2:30PM EST  ADD: Cytology was atypical,  Therefore brought patient for bladder biopsy fulguration and retrogrades.

## 2015-07-21 NOTE — Progress Notes (Signed)
NPO AFTER MN.  ARRIVE AT 0600.  NEEDS ISTAT AND EKG.  WILL TAKE ZANTAC AM DOS W/ SIPS OF WATER.

## 2015-07-24 ENCOUNTER — Encounter (HOSPITAL_BASED_OUTPATIENT_CLINIC_OR_DEPARTMENT_OTHER): Payer: Self-pay | Admitting: Anesthesiology

## 2015-07-24 NOTE — Anesthesia Preprocedure Evaluation (Addendum)
Anesthesia Evaluation  Patient identified by MRN, date of birth, ID band Patient awake    Reviewed: Allergy & Precautions, NPO status , Patient's Chart, lab work & pertinent test results  Airway Mallampati: II  TM Distance: >3 FB Neck ROM: Full    Dental no notable dental hx. (+) Teeth Intact, Dental Advisory Given   Pulmonary neg pulmonary ROS,    Pulmonary exam normal breath sounds clear to auscultation       Cardiovascular Exercise Tolerance: Good hypertension, Pt. on medications negative cardio ROS Normal cardiovascular exam Rhythm:Regular Rate:Normal     Neuro/Psych PSYCHIATRIC DISORDERS Anxiety Depression negative neurological ROS     GI/Hepatic Neg liver ROS, GERD  Medicated,  Endo/Other  negative endocrine ROS  Renal/GU negative Renal ROS  negative genitourinary   Musculoskeletal negative musculoskeletal ROS (+)   Abdominal (+) + obese,   Peds negative pediatric ROS (+)  Hematology negative hematology ROS (+)   Anesthesia Other Findings   Reproductive/Obstetrics negative OB ROS                          Anesthesia Physical Anesthesia Plan  ASA: II  Anesthesia Plan: General   Post-op Pain Management:    Induction: Intravenous  Airway Management Planned: LMA  Additional Equipment:   Intra-op Plan:   Post-operative Plan: Extubation in OR  Informed Consent: I have reviewed the patients History and Physical, chart, labs and discussed the procedure including the risks, benefits and alternatives for the proposed anesthesia with the patient or authorized representative who has indicated his/her understanding and acceptance.   Dental advisory given  Plan Discussed with: CRNA  Anesthesia Plan Comments:         Anesthesia Quick Evaluation

## 2015-07-25 ENCOUNTER — Encounter (HOSPITAL_BASED_OUTPATIENT_CLINIC_OR_DEPARTMENT_OTHER): Payer: Self-pay

## 2015-07-25 ENCOUNTER — Ambulatory Visit (HOSPITAL_BASED_OUTPATIENT_CLINIC_OR_DEPARTMENT_OTHER): Payer: Medicare PPO | Admitting: Anesthesiology

## 2015-07-25 ENCOUNTER — Encounter (HOSPITAL_BASED_OUTPATIENT_CLINIC_OR_DEPARTMENT_OTHER)
Admission: RE | Disposition: A | Discharge status: Home / Self Care | Payer: Self-pay | Source: Ambulatory Visit | Attending: Urology

## 2015-07-25 ENCOUNTER — Ambulatory Visit (HOSPITAL_BASED_OUTPATIENT_CLINIC_OR_DEPARTMENT_OTHER)
Admission: RE | Admit: 2015-07-25 | Discharge: 2015-07-25 | Disposition: A | Discharge status: Home / Self Care | Payer: Medicare PPO | Source: Ambulatory Visit | Attending: Urology | Admitting: Urology

## 2015-07-25 DIAGNOSIS — Z8551 Personal history of malignant neoplasm of bladder: Secondary | ICD-10-CM | POA: Insufficient documentation

## 2015-07-25 DIAGNOSIS — N3020 Other chronic cystitis without hematuria: Secondary | ICD-10-CM | POA: Diagnosis not present

## 2015-07-25 DIAGNOSIS — F329 Major depressive disorder, single episode, unspecified: Secondary | ICD-10-CM | POA: Diagnosis not present

## 2015-07-25 DIAGNOSIS — R3915 Urgency of urination: Secondary | ICD-10-CM | POA: Insufficient documentation

## 2015-07-25 DIAGNOSIS — Z79899 Other long term (current) drug therapy: Secondary | ICD-10-CM | POA: Insufficient documentation

## 2015-07-25 DIAGNOSIS — Z6829 Body mass index (BMI) 29.0-29.9, adult: Secondary | ICD-10-CM | POA: Insufficient documentation

## 2015-07-25 DIAGNOSIS — I10 Essential (primary) hypertension: Secondary | ICD-10-CM | POA: Diagnosis not present

## 2015-07-25 DIAGNOSIS — E669 Obesity, unspecified: Secondary | ICD-10-CM | POA: Insufficient documentation

## 2015-07-25 DIAGNOSIS — R9431 Abnormal electrocardiogram [ECG] [EKG]: Secondary | ICD-10-CM | POA: Insufficient documentation

## 2015-07-25 DIAGNOSIS — K219 Gastro-esophageal reflux disease without esophagitis: Secondary | ICD-10-CM | POA: Insufficient documentation

## 2015-07-25 DIAGNOSIS — Z87440 Personal history of urinary (tract) infections: Secondary | ICD-10-CM | POA: Diagnosis not present

## 2015-07-25 DIAGNOSIS — Z79890 Hormone replacement therapy (postmenopausal): Secondary | ICD-10-CM | POA: Diagnosis not present

## 2015-07-25 DIAGNOSIS — F419 Anxiety disorder, unspecified: Secondary | ICD-10-CM | POA: Diagnosis not present

## 2015-07-25 DIAGNOSIS — D414 Neoplasm of uncertain behavior of bladder: Secondary | ICD-10-CM | POA: Diagnosis present

## 2015-07-25 HISTORY — DX: Neoplasm of unspecified behavior of bladder: D49.4

## 2015-07-25 HISTORY — PX: CYSTOSCOPY WITH BIOPSY: SHX5122

## 2015-07-25 HISTORY — DX: Anxiety disorder, unspecified: F41.9

## 2015-07-25 HISTORY — DX: Essential (primary) hypertension: I10

## 2015-07-25 HISTORY — DX: Gastro-esophageal reflux disease without esophagitis: K21.9

## 2015-07-25 LAB — POCT I-STAT 4, (NA,K, GLUC, HGB,HCT)
Glucose, Bld: 103 mg/dL — ABNORMAL HIGH (ref 65–99)
HCT: 39 % (ref 36.0–46.0)
HEMOGLOBIN: 13.3 g/dL (ref 12.0–15.0)
Potassium: 4.1 mmol/L (ref 3.5–5.1)
SODIUM: 141 mmol/L (ref 135–145)

## 2015-07-25 SURGERY — CYSTOSCOPY, WITH BIOPSY
Anesthesia: General

## 2015-07-25 MED ORDER — FENTANYL CITRATE (PF) 100 MCG/2ML IJ SOLN
INTRAMUSCULAR | Status: AC
  Filled 2015-07-25: qty 4

## 2015-07-25 MED ORDER — FENTANYL CITRATE (PF) 100 MCG/2ML IJ SOLN
25.0000 ug | INTRAMUSCULAR | Status: DC | PRN
  Filled 2015-07-25: qty 1

## 2015-07-25 MED ORDER — LIDOCAINE HCL 2 % EX GEL
CUTANEOUS | Status: DC | PRN
  Administered 2015-07-25: 1 via URETHRAL

## 2015-07-25 MED ORDER — ONDANSETRON HCL 4 MG/2ML IJ SOLN
INTRAMUSCULAR | Status: DC | PRN
  Administered 2015-07-25: 4 mg via INTRAVENOUS

## 2015-07-25 MED ORDER — LIDOCAINE HCL (CARDIAC) 20 MG/ML IV SOLN
INTRAVENOUS | Status: AC
  Filled 2015-07-25: qty 5

## 2015-07-25 MED ORDER — CEFAZOLIN SODIUM 1-5 GM-% IV SOLN
1.0000 g | INTRAVENOUS | Status: DC
  Filled 2015-07-25: qty 50

## 2015-07-25 MED ORDER — PROPOFOL 500 MG/50ML IV EMUL
INTRAVENOUS | Status: DC | PRN
  Administered 2015-07-25: 150 mL via INTRAVENOUS

## 2015-07-25 MED ORDER — CEFAZOLIN SODIUM-DEXTROSE 2-3 GM-% IV SOLR
INTRAVENOUS | Status: AC
  Filled 2015-07-25: qty 50

## 2015-07-25 MED ORDER — PHENYLEPHRINE 40 MCG/ML (10ML) SYRINGE FOR IV PUSH (FOR BLOOD PRESSURE SUPPORT)
PREFILLED_SYRINGE | INTRAVENOUS | Status: AC
  Filled 2015-07-25: qty 10

## 2015-07-25 MED ORDER — MIDAZOLAM HCL 2 MG/2ML IJ SOLN
INTRAMUSCULAR | Status: AC
  Filled 2015-07-25: qty 2

## 2015-07-25 MED ORDER — PHENAZOPYRIDINE HCL 100 MG PO TABS: 100.0000 mg | ORAL_TABLET | Freq: Three times a day (TID) | ORAL | Status: DC | PRN

## 2015-07-25 MED ORDER — ONDANSETRON HCL 4 MG/2ML IJ SOLN
INTRAMUSCULAR | Status: AC
  Filled 2015-07-25: qty 2

## 2015-07-25 MED ORDER — KETOROLAC TROMETHAMINE 30 MG/ML IJ SOLN
INTRAMUSCULAR | Status: AC
  Filled 2015-07-25: qty 1

## 2015-07-25 MED ORDER — LACTATED RINGERS IV SOLN
INTRAVENOUS | Status: DC
  Administered 2015-07-25: 07:00:00 via INTRAVENOUS
  Filled 2015-07-25: qty 1000

## 2015-07-25 MED ORDER — PROMETHAZINE HCL 25 MG/ML IJ SOLN
6.2500 mg | INTRAMUSCULAR | Status: DC | PRN
  Filled 2015-07-25: qty 1

## 2015-07-25 MED ORDER — PROPOFOL 10 MG/ML IV BOLUS
INTRAVENOUS | Status: AC
  Filled 2015-07-25: qty 40

## 2015-07-25 MED ORDER — ARTIFICIAL TEARS OP OINT
TOPICAL_OINTMENT | OPHTHALMIC | Status: AC
  Filled 2015-07-25: qty 3.5

## 2015-07-25 MED ORDER — DEXAMETHASONE SODIUM PHOSPHATE 10 MG/ML IJ SOLN
INTRAMUSCULAR | Status: AC
  Filled 2015-07-25: qty 1

## 2015-07-25 MED ORDER — KETOROLAC TROMETHAMINE 30 MG/ML IJ SOLN
INTRAMUSCULAR | Status: DC | PRN
  Administered 2015-07-25: 30 mg via INTRAVENOUS

## 2015-07-25 MED ORDER — STERILE WATER FOR IRRIGATION IR SOLN
Status: DC | PRN
  Administered 2015-07-25: 3000 mL via INTRAVESICAL

## 2015-07-25 MED ORDER — FENTANYL CITRATE (PF) 100 MCG/2ML IJ SOLN
INTRAMUSCULAR | Status: DC | PRN
  Administered 2015-07-25: 50 ug via INTRAVENOUS

## 2015-07-25 MED ORDER — DEXAMETHASONE SODIUM PHOSPHATE 10 MG/ML IJ SOLN
INTRAMUSCULAR | Status: DC | PRN
  Administered 2015-07-25: 10 mg via INTRAVENOUS

## 2015-07-25 MED ORDER — MIDAZOLAM HCL 5 MG/5ML IJ SOLN
INTRAMUSCULAR | Status: DC | PRN
  Administered 2015-07-25: 2 mg via INTRAVENOUS

## 2015-07-25 MED ORDER — EPHEDRINE SULFATE 50 MG/ML IJ SOLN
INTRAMUSCULAR | Status: DC | PRN
  Administered 2015-07-25: 15 mg via INTRAVENOUS
  Administered 2015-07-25: 10 mg via INTRAVENOUS

## 2015-07-25 MED ORDER — LIDOCAINE HCL (CARDIAC) 20 MG/ML IV SOLN
INTRAVENOUS | Status: DC | PRN
  Administered 2015-07-25: 70 mg via INTRAVENOUS

## 2015-07-25 MED ORDER — PHENYLEPHRINE HCL 10 MG/ML IJ SOLN
INTRAMUSCULAR | Status: DC | PRN
  Administered 2015-07-25: 80 ug via INTRAVENOUS
  Administered 2015-07-25 (×2): 120 ug via INTRAVENOUS
  Administered 2015-07-25: 80 ug via INTRAVENOUS

## 2015-07-25 SURGICAL SUPPLY — 14 items
BAG DRAIN URO-CYSTO SKYTR STRL (DRAIN) ×2 IMPLANT
CANISTER SUCT LVC 12 LTR MEDI- (MISCELLANEOUS) IMPLANT
CLOTH BEACON ORANGE TIMEOUT ST (SAFETY) ×2 IMPLANT
ELECT REM PT RETURN 9FT ADLT (ELECTROSURGICAL) ×2
ELECTRODE REM PT RTRN 9FT ADLT (ELECTROSURGICAL) ×1 IMPLANT
GLOVE BIO SURGEON STRL SZ7.5 (GLOVE) ×2 IMPLANT
GOWN STRL REUS W/ TWL XL LVL3 (GOWN DISPOSABLE) ×1 IMPLANT
GOWN STRL REUS W/TWL XL LVL3 (GOWN DISPOSABLE) ×1
KIT ROOM TURNOVER WOR (KITS) ×2 IMPLANT
NEEDLE HYPO 22GX1.5 SAFETY (NEEDLE) IMPLANT
PACK CYSTO (CUSTOM PROCEDURE TRAY) ×2 IMPLANT
TUBE CONNECTING 12X1/4 (SUCTIONS) IMPLANT
WATER STERILE IRR 3000ML UROMA (IV SOLUTION) ×2 IMPLANT
WATER STERILE IRR 500ML POUR (IV SOLUTION) ×2 IMPLANT

## 2015-07-25 NOTE — Op Note (Signed)
Preoperative diagnosis: Bladder neoplasm of uncertain behavior, personal history bladder cancer Postoperative diagnosis: Same  Procedure: Cystoscopy bladder biopsy fulguration, 0.5 cm or less  Surgeon: Junious Silk  Anesthesia: Gen.  Indication for procedure: 69 year old with history of bladder cancer with some erythematous areas right, dome and left on office cystoscopy with atypical cytology. She was brought today for cystoscopy and bladder biopsy.  Findings: On exam under anesthesia the patient had vaginal atrophy with no vulvar lesions, the bladder and the urethra were palpably normal.  On cystoscopy the urethra was normal, the trigone and ureteral orifices were in their normal orthotopic position. The bladder contained no stone or foreign body. There were some erythematous areas surrounding a prior right biopsy site, the dome was clear with no significant erythema or mucosal abnormality, too small areas of erythema surrounding a prior left biopsy site which were about the width of the Bugbee tip in simply fulgurated. Only the right side was substantial enough to biopsy. Overall the cystoscopic view in the OR under anesthesia with the rigid scope was more benign appearing compared to the office cystoscopy.  Description of procedure: After consent was obtained patient brought to the operating room. After adequate anesthesia she was placed in lithotomy position and prepped and draped in the usual sterile fashion. A timeout was performed to confirm the patient and procedure. An exam under anesthesia was performed, the cystoscope was passed per urethra and the bladder carefully inspected with the 30 and 70 lens. The 30 lens was passed and the flexible biopsy forceps were used to biopsy the right erythema 2. The biopsy site and other scattered abnormal appearing mucosa in that area was lightly fulgurated. On further inspection that there was no concern or placed a biopsy at the dome. And on the left  there were couple of.severe edema that were lightly fulgurated with the Bugbee. These were not biopsied. Hemostasis was excellent after the bladder was drained. The scope was removed. Some lidocaine jelly was passed per urethra and she was awakened and taken to the recovery room in stable condition.  Complications: None  Blood loss: Minimal  Specimens: Right bladder biopsy to pathology  Drains: None  Disposition: Patient stable to PACU

## 2015-07-25 NOTE — Interval H&P Note (Signed)
History and Physical Interval Note:  07/25/2015 7:28 AM  Christy Cardenas  has presented today for surgery, with the diagnosis of MALIGNANT NEOPLASM OF LATERAL BLADDER WALL  The various methods of treatment have been discussed with the patient and family. After consideration of risks, benefits and other options for treatment, the patient has consented to  Procedure(s): [place] (N/A) as a surgical intervention .  The patient's history has been reviewed, patient examined, no change in status, stable for surgery.  I have reviewed the patient's chart and labs.  Questions were answered to the patient's satisfaction.  She elects to proceed. She has had no dysuria or gross hematuria.    Hollister Wessler

## 2015-07-25 NOTE — Discharge Instructions (Signed)
Cystoscopy Cystoscopy is a procedure that is used to help your caregiver diagnose and sometimes treat conditions that affect your lower urinary tract. Your lower urinary tract includes your bladder and the tube through which urine passes from your bladder out of your body (urethra). Cystoscopy is performed with a thin, tube-shaped instrument (cystoscope). The cystoscope has lenses and a light at the end so that your caregiver can see inside your bladder. The cystoscope is inserted at the entrance of your urethra. Your caregiver guides it through your urethra and into your bladder. There are two main types of cystoscopy:  Flexible cystoscopy (with a flexible cystoscope).  Rigid cystoscopy (with a rigid cystoscope). Cystoscopy may be recommended for many conditions, including:  Urinary tract infections.  Blood in your urine (hematuria).  Loss of bladder control (urinary incontinence) or overactive bladder.  Unusual cells found in a urine sample.  Urinary blockage.  Painful urination. Cystoscopy may also be done to remove a sample of your tissue to be checked under a microscope (biopsy). It may also be done to remove or destroy bladder stones. LET YOUR CAREGIVER KNOW ABOUT:  Allergies to food or medicine.  Medicines taken, including vitamins, herbs, eyedrops, over-the-counter medicines, and creams.  Use of steroids (by mouth or creams).  Previous problems with anesthetics or numbing medicines.  History of bleeding problems or blood clots.  Previous surgery.  Other health problems, including diabetes and kidney problems.  Possibility of pregnancy, if this applies. PROCEDURE The area around the opening to your urethra will be cleaned. A medicine to numb your urethra (local anesthetic) is used. If a tissue sample or stone is removed during the procedure, you may be given a medicine to make you sleep (general anesthetic). Your caregiver will gently insert the tip of the cystoscope  into your urethra. The cystoscope will be slowly glided through your urethra and into your bladder. Sterile fluid will flow through the cystoscope and into your bladder. The fluid will expand and stretch your bladder. This gives your caregiver a better view of your bladder walls. The procedure lasts about 15-20 minutes. AFTER THE PROCEDURE If a local anesthetic is used, you will be allowed to go home as soon as you are ready. If a general anesthetic is used, you will be taken to a recovery area until you are stable. You may have temporary bleeding and burning on urination.   This information is not intended to replace advice given to you by your health care provider. Make sure you discuss any questions you have with your health care provider.     CYSTOSCOPY HOME CARE INSTRUCTIONS   Activity: Rest for the remainder of the day.  Do not drive or operate equipment today.  You may resume normal activities in one to two days as instructed by your physician.   Meals: Drink plenty of liquids and eat light foods such as gelatin or soup this evening.  You may return to a normal meal plan tomorrow.  Return to Work: You may return to work in one to two days or as instructed by your physician.  Special Instructions / Symptoms: Call your physician if any of these symptoms occur:   -persistent or heavy bleeding  -bleeding which continues after first few urination  -large blood clots that are difficult to pass  -urine stream diminishes or stops completely  -fever equal to or higher than 101 degrees Farenheit.  -cloudy urine with a strong, foul odor  -severe pain  Females should always wipe  from front to back after elimination.  You may feel some burning pain when you urinate.  This should disappear with time.  Applying moist heat to the lower abdomen or a hot tub bath may help relieve the pain. \  Follow-Up / Date of Return Visit to Your Physician:  Call for an appointment to arrange  follow-up.       Post Anesthesia Home Care Instructions  Activity: Get plenty of rest for the remainder of the day. A responsible adult should stay with you for 24 hours following the procedure.  For the next 24 hours, DO NOT: -Drive a car -Paediatric nurse -Drink alcoholic beverages -Take any medication unless instructed by your physician -Make any legal decisions or sign important papers.  Meals: Start with liquid foods such as gelatin or soup. Progress to regular foods as tolerated. Avoid greasy, spicy, heavy foods. If nausea and/or vomiting occur, drink only clear liquids until the nausea and/or vomiting subsides. Call your physician if vomiting continues.  Special Instructions/Symptoms: Your throat may feel dry or sore from the anesthesia or the breathing tube placed in your throat during surgery. If this causes discomfort, gargle with warm salt water. The discomfort should disappear within 24 hours.  If you had a scopolamine patch placed behind your ear for the management of post- operative nausea and/or vomiting:  1. The medication in the patch is effective for 72 hours, after which it should be removed.  Wrap patch in a tissue and discard in the trash. Wash hands thoroughly with soap and water. 2. You may remove the patch earlier than 72 hours if you experience unpleasant side effects which may include dry mouth, dizziness or visual disturbances. 3. Avoid touching the patch. Wash your hands with soap and water after contact with the patch.

## 2015-07-25 NOTE — Anesthesia Procedure Notes (Signed)
Procedure Name: LMA Insertion Date/Time: 07/25/2015 7:40 AM Performed by: Wanita Chamberlain Pre-anesthesia Checklist: Patient identified, Timeout performed, Emergency Drugs available, Suction available and Patient being monitored Patient Re-evaluated:Patient Re-evaluated prior to inductionOxygen Delivery Method: Circle system utilized Preoxygenation: Pre-oxygenation with 100% oxygen Intubation Type: IV induction Ventilation: Mask ventilation without difficulty LMA: LMA inserted LMA Size: 4.0 Number of attempts: 1 Airway Equipment and Method: Bite block Placement Confirmation: breath sounds checked- equal and bilateral and positive ETCO2 Tube secured with: Tape Dental Injury: Teeth and Oropharynx as per pre-operative assessment

## 2015-07-25 NOTE — Anesthesia Postprocedure Evaluation (Signed)
Anesthesia Post Note  Patient: Christy Cardenas  Procedure(s) Performed: Procedure(s) (LRB): CYSTOSCOPY WITH BLADDER  BIOPSY FULGERATION (N/A)  Patient location during evaluation: PACU Anesthesia Type: General Level of consciousness: awake and alert Pain management: pain level controlled Vital Signs Assessment: post-procedure vital signs reviewed and stable Respiratory status: spontaneous breathing, nonlabored ventilation, respiratory function stable and patient connected to nasal cannula oxygen Cardiovascular status: blood pressure returned to baseline and stable Postop Assessment: no signs of nausea or vomiting Anesthetic complications: no    Last Vitals:  Filed Vitals:   07/25/15 0900 07/25/15 1009  BP: 144/75 139/95  Pulse: 90 94  Temp:  36.9 C  Resp: 13 16    Last Pain: There were no vitals filed for this visit.               Claudy Abdallah J

## 2015-07-25 NOTE — Transfer of Care (Signed)
Immediate Anesthesia Transfer of Care Note  Patient: Christy Cardenas  Procedure(s) Performed: Procedure(s): [place] (N/A)  Patient Location: PACU  Anesthesia Type:General  Level of Consciousness: awake, alert , oriented and patient cooperative  Airway & Oxygen Therapy: Patient Spontanous Breathing and Patient connected to nasal cannula oxygen  Post-op Assessment: Report given to RN and Post -op Vital signs reviewed and stable  Post vital signs: Reviewed and stable  Last Vitals:  Filed Vitals:   07/25/15 0619  BP: 150/95  Pulse: 87  Temp: 36.8 C  Resp: 16    Complications: No apparent anesthesia complications

## 2015-07-26 ENCOUNTER — Encounter (HOSPITAL_BASED_OUTPATIENT_CLINIC_OR_DEPARTMENT_OTHER): Payer: Self-pay | Admitting: Urology

## 2015-07-28 ENCOUNTER — Other Ambulatory Visit: Payer: Self-pay | Admitting: Otolaryngology

## 2015-07-28 DIAGNOSIS — H9011 Conductive hearing loss, unilateral, right ear, with unrestricted hearing on the contralateral side: Secondary | ICD-10-CM

## 2015-08-18 ENCOUNTER — Ambulatory Visit
Admission: RE | Admit: 2015-08-18 | Discharge: 2015-08-18 | Disposition: A | Discharge status: Home / Self Care | Payer: Medicare Other | Source: Ambulatory Visit | Attending: Otolaryngology | Admitting: Otolaryngology

## 2015-08-18 DIAGNOSIS — G319 Degenerative disease of nervous system, unspecified: Secondary | ICD-10-CM | POA: Diagnosis not present

## 2015-08-18 DIAGNOSIS — H9011 Conductive hearing loss, unilateral, right ear, with unrestricted hearing on the contralateral side: Secondary | ICD-10-CM | POA: Diagnosis present

## 2015-08-18 DIAGNOSIS — I6782 Cerebral ischemia: Secondary | ICD-10-CM | POA: Insufficient documentation

## 2015-08-18 MED ORDER — GADOBENATE DIMEGLUMINE 529 MG/ML IV SOLN
20.0000 mL | Freq: Once | INTRAVENOUS | Status: AC | PRN
  Administered 2015-08-18: 17 mL via INTRAVENOUS

## 2015-10-03 ENCOUNTER — Encounter: Payer: Self-pay | Admitting: Podiatry

## 2015-10-03 ENCOUNTER — Ambulatory Visit (INDEPENDENT_AMBULATORY_CARE_PROVIDER_SITE_OTHER): Payer: Medicare Other | Admitting: Podiatry

## 2015-10-03 ENCOUNTER — Ambulatory Visit (INDEPENDENT_AMBULATORY_CARE_PROVIDER_SITE_OTHER): Payer: Medicare Other

## 2015-10-03 VITALS — BP 118/84 | HR 92 | Resp 16 | Ht 65.0 in | Wt 185.0 lb

## 2015-10-03 DIAGNOSIS — M779 Enthesopathy, unspecified: Secondary | ICD-10-CM

## 2015-10-03 DIAGNOSIS — M79672 Pain in left foot: Principal | ICD-10-CM

## 2015-10-03 DIAGNOSIS — M6588 Other synovitis and tenosynovitis, other site: Secondary | ICD-10-CM

## 2015-10-03 DIAGNOSIS — M79671 Pain in right foot: Secondary | ICD-10-CM | POA: Diagnosis not present

## 2015-10-03 DIAGNOSIS — M7750 Other enthesopathy of unspecified foot: Secondary | ICD-10-CM

## 2015-10-03 MED ORDER — MELOXICAM 15 MG PO TABS: 15.0000 mg | ORAL_TABLET | Freq: Every day | ORAL | Status: DC

## 2015-10-03 NOTE — Progress Notes (Signed)
   Subjective:    Patient ID: [name], female    DOB: 1946-01-06, 70 y.o.   MRN: [id number]  HPI : She presents today with a several month history of pain to the dorsal medial aspect of the right foot. She has a history of a fractured ankle several years ago however relates that early this year she also injured her foot. She has tried oxycodone to no avail and anti-inflammatories to no avail.   Review of Systems  HENT: Positive for hearing loss.   All other systems reviewed and are negative.      Objective:   Physical Exam:vital signs are stable she is alert and oriented 3. Pulses are palpable. Neurologic sensorium is intact per Semmes-Weinstein monofilament. Deep tendon reflexes are intact. Muscle strength +5 over 5 dorsiflexion plantar flexors and inverters everters all intrinsic musculature is intact. Orthopedic evaluation and x-rays all joints distal to the ankle for range of motion without crepitation. Cutaneous evaluation demonstrates supple well-hydrated cutis no erythema edema saline as drainage or odor. She does have pain on palpation at the insertion site of the tibialis anterior tendon medial aspect of the right foot but does not demonstrate any significant findings other than an old navicular fracture in this area. An ostomy navicular area is also present but is not painful on palpation. Normal cutaneous evaluation is present.        Assessment & Plan:  Assessment: Insertional tibialis anterior tendinitis.  Plan: Inject the area today with dexamethasone and local anesthetic placed her in a Cam Walker and posterior own and nonsteroidal anti-inflammatory. Next visit we will consider nitroglycerin patch. We will also consider physical therapy.

## 2015-10-31 ENCOUNTER — Ambulatory Visit (INDEPENDENT_AMBULATORY_CARE_PROVIDER_SITE_OTHER): Payer: Medicare Other | Admitting: Podiatry

## 2015-10-31 VITALS — BP 110/73 | HR 104 | Resp 16

## 2015-10-31 DIAGNOSIS — M6588 Other synovitis and tenosynovitis, other site: Secondary | ICD-10-CM

## 2015-10-31 DIAGNOSIS — M7750 Other enthesopathy of unspecified foot: Secondary | ICD-10-CM

## 2015-11-01 NOTE — Progress Notes (Signed)
She presents today for follow-up of her tibialis anterior tendinitis at its insertion right foot. She states that she is 100% improved. She was unable to wear the boot for very long. She states that even so taking the medication has rendered her asymptomatic.  Objective: Vital signs stable alert and oriented 3. No reproducible pain no fluctuance on palpation of the tibialis anterior tendon at its insertion site. No erythema cellulitis drainage or odor.  Assessment: Well-healing insertional tendinitis.  Plan: Continue all conservative therapies for 1 more month follow-up with me as needed.

## 2015-12-26 ENCOUNTER — Telehealth: Payer: Self-pay | Admitting: *Deleted

## 2015-12-26 MED ORDER — MELOXICAM 15 MG PO TABS: 15.0000 mg | ORAL_TABLET | Freq: Every day | ORAL | Status: DC

## 2015-12-26 NOTE — Telephone Encounter (Signed)
Fax request for 90 days Meloxicam 15mg .  Dr. Marta Antu +2 refills.

## 2016-11-28 ENCOUNTER — Ambulatory Visit: Payer: Medicare Other | Admitting: Podiatry

## 2016-12-03 ENCOUNTER — Ambulatory Visit (INDEPENDENT_AMBULATORY_CARE_PROVIDER_SITE_OTHER): Payer: Medicare Other | Admitting: Podiatry

## 2016-12-03 ENCOUNTER — Encounter: Payer: Self-pay | Admitting: Podiatry

## 2016-12-03 DIAGNOSIS — M76811 Anterior tibial syndrome, right leg: Secondary | ICD-10-CM | POA: Diagnosis not present

## 2016-12-03 DIAGNOSIS — M779 Enthesopathy, unspecified: Secondary | ICD-10-CM

## 2016-12-03 DIAGNOSIS — M778 Other enthesopathies, not elsewhere classified: Secondary | ICD-10-CM

## 2016-12-03 DIAGNOSIS — M7751 Other enthesopathy of right foot: Secondary | ICD-10-CM

## 2016-12-03 MED ORDER — MELOXICAM 15 MG PO TABS: 15.0000 mg | ORAL_TABLET | Freq: Every day | ORAL | 3 refills | Status: DC

## 2016-12-03 NOTE — Progress Notes (Signed)
She presents today with a chief complaint of pain to the dorsal medial aspect of the first metatarsophalangeal joint and at the tibialis anterior insertion site. States that he was doing fine for a long time and I started walking more for exercise and is starting to burn and recur.  Objective: Vital signs are stable to alert 3 has alcohol pain first metatarsophalangeal joint right foot with pain on plantarflexion of the first metatarsophalangeal joint right foot. He has mild tenderness on palpation of the tibialis anterior at its insertion site dorsal medial right foot.  Assessment: Capsulitis of the first metatarsophalangeal joint with neuritis. She also has tibialis anterior tendinitis.  Plan: Injected the first metatarsophalangeal joint today after sterile Betadine skin prep. Started her on meloxicam. Discussed appropriate shoe gear stretching exercises ice therapy will follow up with her in 1 month.

## 2016-12-05 ENCOUNTER — Telehealth: Payer: Self-pay | Admitting: Podiatry

## 2016-12-05 NOTE — Telephone Encounter (Signed)
Pt called and said she was just at [facility] and they did not have the meloxicam rx. It shows it went when I looked can we please resend it to [facility] dr in [place].

## 2016-12-06 NOTE — Telephone Encounter (Signed)
I contacted pharmacy to see if they received rx, per Christy Cardenas at [facility], they did receive script, however she picked up a 90 Day supply of Meloxicam on 09/28/16 and they will not refill new script until 12/27/16.  Patient was contacted and voice mail left informing of of medication.

## 2016-12-28 ENCOUNTER — Other Ambulatory Visit: Payer: Self-pay

## 2016-12-28 MED ORDER — MELOXICAM 15 MG PO TABS: 15.0000 mg | ORAL_TABLET | Freq: Every day | ORAL | 1 refills | Status: AC

## 2017-01-09 ENCOUNTER — Ambulatory Visit (INDEPENDENT_AMBULATORY_CARE_PROVIDER_SITE_OTHER): Payer: Medicare Other | Admitting: Podiatry

## 2017-01-09 ENCOUNTER — Encounter: Payer: Self-pay | Admitting: Podiatry

## 2017-01-09 DIAGNOSIS — M7751 Other enthesopathy of right foot: Secondary | ICD-10-CM | POA: Diagnosis not present

## 2017-01-09 DIAGNOSIS — M76811 Anterior tibial syndrome, right leg: Secondary | ICD-10-CM

## 2017-01-09 MED ORDER — METHYLPREDNISOLONE 4 MG PO TBPK: ORAL_TABLET | ORAL | 0 refills | Status: DC

## 2017-01-09 NOTE — Progress Notes (Signed)
She presents today for follow-up of the neuritis to the first metatarsophalangeal joint and the tibialis anterior tendinitis. We injected around the first metatarsophalangeal joint last time. She states the majority of her pain she notices now is along the dorsal and dorsal medial aspect of the base of the first metatarsal.  Objective: Vital signs are stable alert and oriented 3 much decrease in pain on palpation of the first metatarsal angle joint. The majority of today's pain is in and around the insertion site of the tibialis anterior of the right foot.  Assessment: Insertional tendinitis of the tibialis anterior tendon right first metatarsal.  Plan: Encouraged her to wear the Cam Walker that she has at home. I encouraged her dye's the foot. I injected this area today with dexamethasone and local anesthetic to the point of maximal tenderness not injecting into the tendon itself. Plantar fasciitis is doing much better with no treatment today. Follow-up with me in 1 month

## 2017-02-25 ENCOUNTER — Ambulatory Visit: Payer: Medicare Other | Admitting: Podiatry

## 2017-08-20 ENCOUNTER — Ambulatory Visit: Payer: Medicare Other | Admitting: Podiatry

## 2017-08-20 ENCOUNTER — Encounter: Payer: Self-pay | Admitting: Podiatry

## 2017-08-20 DIAGNOSIS — M76811 Anterior tibial syndrome, right leg: Secondary | ICD-10-CM

## 2017-08-21 ENCOUNTER — Telehealth: Payer: Self-pay | Admitting: *Deleted

## 2017-08-21 ENCOUNTER — Other Ambulatory Visit: Payer: Self-pay

## 2017-08-21 DIAGNOSIS — M76811 Anterior tibial syndrome, right leg: Secondary | ICD-10-CM

## 2017-08-21 NOTE — Telephone Encounter (Signed)
Orders to J. Quintana, RN for pre-cert, faxed to [facility]. 

## 2017-08-21 NOTE — Progress Notes (Signed)
She presents today states that the tibialis anterior tendon that she had bothering her previously is has just never stopped hurting.  He states that he been hurts in here now she points to the posterior tibial tendon of the right foot.  Objective: Vital signs are stable alert and oriented x3.  Pulses are palpable.  She still has fluctuance and pain on palpation of the tibialis anterior at its insertion site on the medial cuneiform first metatarsal base area she also has fluctuance in the posterior tibial tendon as it courses beneath the medial malleolus extending to the navicular tuberosity.  Dorsiflexion against resistance is painful dorsiflexion and inversion is even more painful abduction against resistance/inversion against resistance is extremely painful.  Assessment: Cannot rule out a tear to both the tibialis anterior tendon at its insertion as well as a posterior tibial tendinitis and possible tear.  Plan: Discussed etiology pathology conservative versus surgical therapies.  At this point since this has been ongoing for many months almost 2 years now I feel is necessary to assess with an MRI for surgical intervention more than more than likely there is a tear of the tibialis anterior.

## 2017-08-21 NOTE — Telephone Encounter (Signed)
-----   Message from Rip Harbour, [facility] sent at 08/20/2017  4:10 PM EST ----- Regarding: MRI  MRI right heel - posterior tibial insertional tear and tibialis anterior insertional tear right foot - surgical consideration

## 2017-09-03 ENCOUNTER — Telehealth: Payer: Self-pay | Admitting: *Deleted

## 2017-09-03 ENCOUNTER — Ambulatory Visit: Admission: RE | Admit: 2017-09-03 | Payer: Medicare Other | Source: Ambulatory Visit

## 2017-09-03 ENCOUNTER — Ambulatory Visit
Admission: RE | Admit: 2017-09-03 | Discharge: 2017-09-03 | Disposition: A | Discharge status: Home / Self Care | Payer: Medicare Other | Source: Ambulatory Visit | Attending: Podiatry | Admitting: Podiatry

## 2017-09-03 DIAGNOSIS — M19071 Primary osteoarthritis, right ankle and foot: Secondary | ICD-10-CM | POA: Diagnosis not present

## 2017-09-03 DIAGNOSIS — M76811 Anterior tibial syndrome, right leg: Secondary | ICD-10-CM | POA: Diagnosis present

## 2017-09-03 NOTE — Telephone Encounter (Signed)
Dr. Milinda Pointer states that will be fine. I informed [facility], Dr. Milinda Pointer had okayed to just perform the MRI of the ankle. Kenney Houseman states she will cancel the MRI foot order.

## 2017-09-03 NOTE — Telephone Encounter (Signed)
[facility] states the structures Dr.Hyatt is wanting will be covered by the ankle MRI.

## 2017-09-04 ENCOUNTER — Telehealth: Payer: Self-pay | Admitting: *Deleted

## 2017-09-04 NOTE — Telephone Encounter (Signed)
I informed pt of Dr. Stephenie Acres review of results and request to send copy of MRI disc to a radiology specialist of for more indepth reading for diagnosis and treatment planning. Faxed request for copy of MRI disc to [facility].

## 2017-09-04 NOTE — Telephone Encounter (Signed)
-----   Message from Garrel Ridgel, [place] sent at 09/03/2017  5:25 PM EST ----- Send for over read and inform patient of the delay.

## 2017-09-09 NOTE — Telephone Encounter (Signed)
Mailed copy of MRI disc to [facility]. 

## 2017-09-12 ENCOUNTER — Encounter: Payer: Self-pay | Admitting: Podiatry

## 2017-09-30 ENCOUNTER — Ambulatory Visit (INDEPENDENT_AMBULATORY_CARE_PROVIDER_SITE_OTHER): Payer: Medicare Other | Admitting: Podiatry

## 2017-09-30 ENCOUNTER — Encounter: Payer: Self-pay | Admitting: Podiatry

## 2017-09-30 DIAGNOSIS — M722 Plantar fascial fibromatosis: Secondary | ICD-10-CM

## 2017-09-30 NOTE — Progress Notes (Signed)
She presents today chief complaint of pain to the dorsal and dorsal medial aspect of the right foot.  She states that it really has improved since she has been taking ibuprofen and Tylenol on a daily basis.  She relates a history of shockwave therapy to the right heel the resolved plantar fasciitis once before.  Objective: Vital signs are stable alert oriented x3 she has pain to palpation medial calcaneal tubercle of the right heel today.  Still has tenderness on frontal plane range of motion of the tarsometatarsal joints and the talonavicular joint.  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  Mild Achilles tendinitis is also noted.  Without a tear.  Assessment: Plantar fasciitis is most likely resulting in some of her midfoot pain particularly since she has osteoarthritic changes.  Plan: Discussed etiology pathology conservative versus surgical therapies.  At this point she is to continue her anti-inflammatory and Tylenol.  I injected the right heel today with Kenalog and local anesthetic after sterile Betadine skin prep 20 mg of Kenalog and 5 mg Marcaine were utilized after verbal consent.  I will follow-up with her in about 6 weeks.  May need to consider orthotics.

## 2017-11-11 ENCOUNTER — Ambulatory Visit: Payer: Medicare Other | Admitting: Podiatry

## 2018-08-13 HISTORY — PX: YAG LASER APPLICATION: SHX6189

## 2018-12-01 ENCOUNTER — Other Ambulatory Visit: Payer: Self-pay | Admitting: Internal Medicine

## 2018-12-01 DIAGNOSIS — Z1231 Encounter for screening mammogram for malignant neoplasm of breast: Secondary | ICD-10-CM

## 2019-07-15 ENCOUNTER — Other Ambulatory Visit: Payer: Self-pay | Admitting: Family Medicine

## 2019-07-15 DIAGNOSIS — Z1231 Encounter for screening mammogram for malignant neoplasm of breast: Secondary | ICD-10-CM
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
## 2019-08-25 ENCOUNTER — Encounter (INDEPENDENT_AMBULATORY_CARE_PROVIDER_SITE_OTHER): Payer: Self-pay | Admitting: Ophthalmology

## 2019-08-25 ENCOUNTER — Ambulatory Visit (INDEPENDENT_AMBULATORY_CARE_PROVIDER_SITE_OTHER): Payer: Medicare PPO | Admitting: Ophthalmology

## 2019-08-25 ENCOUNTER — Other Ambulatory Visit: Payer: Self-pay

## 2019-08-25 DIAGNOSIS — H3581 Retinal edema: Secondary | ICD-10-CM | POA: Diagnosis not present

## 2019-08-25 DIAGNOSIS — I10 Essential (primary) hypertension: Secondary | ICD-10-CM

## 2019-08-25 DIAGNOSIS — H35033 Hypertensive retinopathy, bilateral: Secondary | ICD-10-CM

## 2019-08-25 DIAGNOSIS — Z961 Presence of intraocular lens: Secondary | ICD-10-CM

## 2019-08-25 DIAGNOSIS — H35373 Puckering of macula, bilateral: Secondary | ICD-10-CM | POA: Diagnosis not present

## 2019-10-03 ENCOUNTER — Ambulatory Visit: Payer: Medicare PPO | Attending: Internal Medicine

## 2019-10-03 DIAGNOSIS — Z23 Encounter for immunization: Secondary | ICD-10-CM

## 2019-10-03 NOTE — Progress Notes (Signed)
   Covid-19 Vaccination Clinic  Name:  Christy Cardenas    MRN: [id number] DOB: 1946-07-12  10/03/2019  [name] was observed post Covid-19 immunization for 15 minutes without incidence. She was provided with Vaccine Information Sheet and instruction to access the V-Safe system.   [name] was instructed to call 911 with any severe reactions post vaccine: Marland Kitchen Difficulty breathing  . Swelling of your face and throat  . A fast heartbeat  . A bad rash all over your body  . Dizziness and weakness    Immunizations Administered    Name Date Dose VIS Date Route   Pfizer COVID-19 Vaccine 10/03/2019  9:53 AM 0.3 mL 07/24/2019 Intramuscular   Manufacturer: [place]   Lot: [id number]   [place]: [id number]

## 2019-10-27 ENCOUNTER — Ambulatory Visit: Payer: Medicare PPO | Attending: Internal Medicine

## 2019-10-27 DIAGNOSIS — Z23 Encounter for immunization: Secondary | ICD-10-CM

## 2019-10-27 NOTE — Progress Notes (Signed)
   Covid-19 Vaccination Clinic  Name:  Christy Cardenas    MRN: [id number] DOB: [birth year]  10/27/2019  Christy Cardenas was observed post Covid-19 immunization for 15 minutes without incident. She was provided with Vaccine Information Sheet and instruction to access the V-Safe system.   Christy Cardenas was instructed to call 911 with any severe reactions post vaccine: Marland Kitchen Difficulty breathing  . Swelling of face and throat  . A fast heartbeat  . A bad rash all over body  . Dizziness and weakness   Immunizations Administered    Name Date Dose VIS Date Route   Pfizer COVID-19 Vaccine 10/27/2019  1:06 PM 0.3 mL 07/24/2019 Intramuscular   Manufacturer: [place]   Lot: [id number]   [place]: [id number]

## 2019-11-10 IMAGING — MR MR ANKLE*R* W/O CM
8 series · 40 of 40 positions shown · non-contrast
Comparison: None.

CLINICAL DATA: Pain at the top of the foot up into the ankle for 2
years. No swelling.

EXAM:
MRI OF THE RIGHT ANKLE WITHOUT CONTRAST
TECHNIQUE: Multiplanar, multisequence MR imaging of the ankle was performed. No
intravenous contrast was administered.

[Series 5: PD fat-sat · axial · 3.0mm · 0.35mm/px · z∈[-109,+31]mm · 6 of 40 slices shown]
[im 1/40]
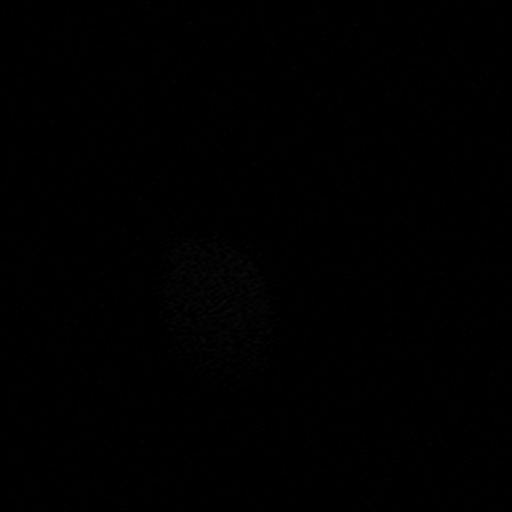
[im 8/40]
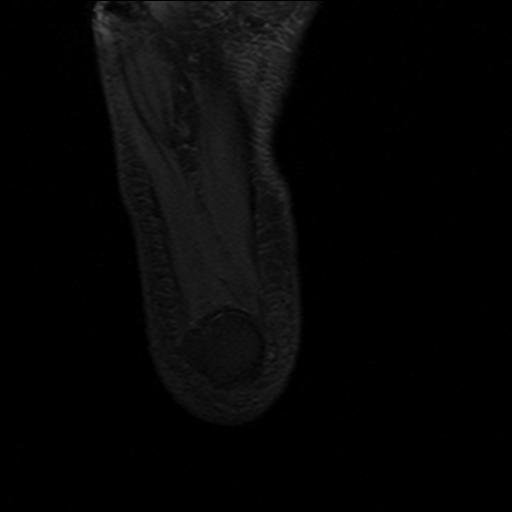
[im 16/40]
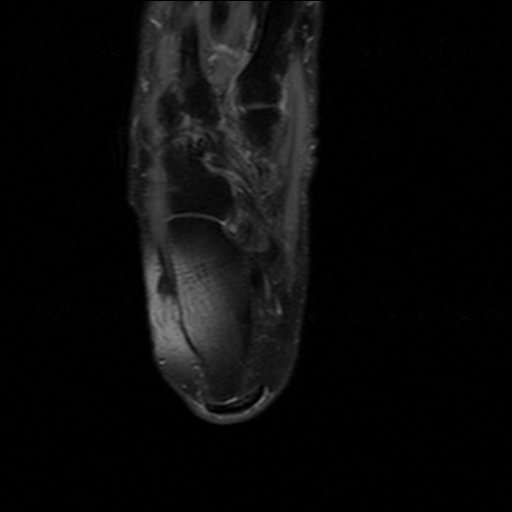
[im 24/40]
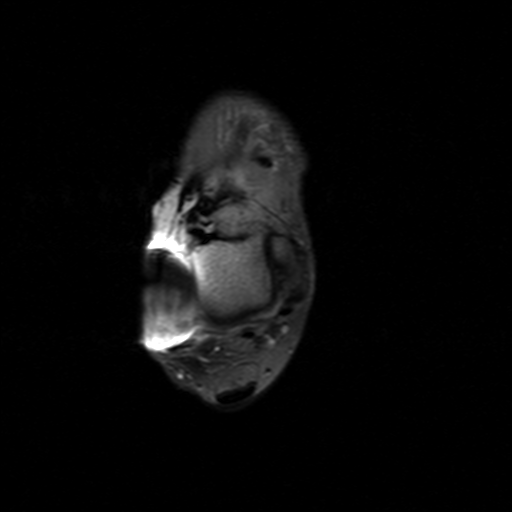
[im 32/40]
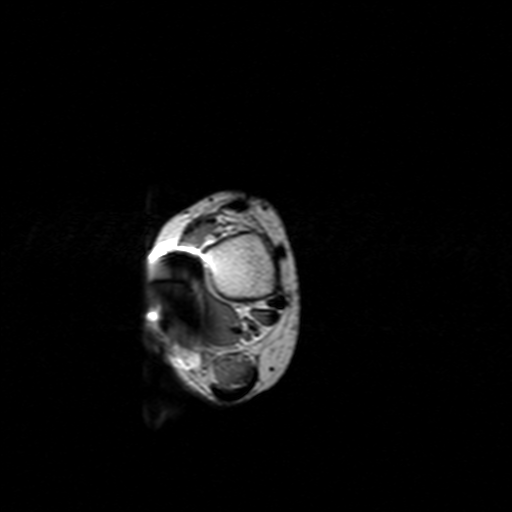
[im 40/40]
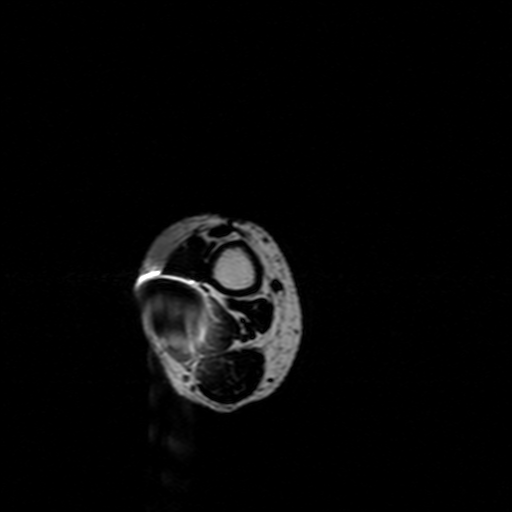

[Series 6: T2 fat-sat · axial · 3.0mm · 0.56mm/px · z∈[-109,+31]mm · 5 of 40 slices shown (1 of 3)]
[im 1/40]
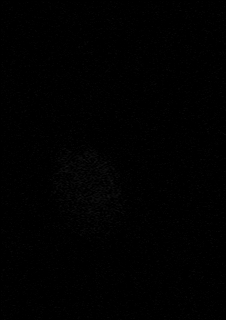
[im 10/40]
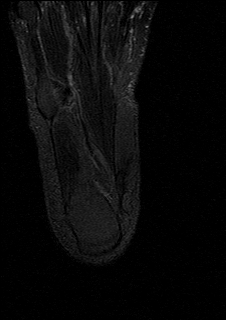
[im 20/40]
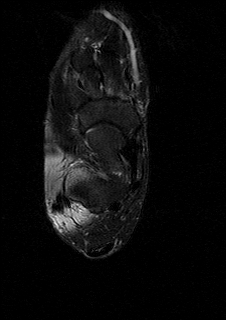
[im 30/40]
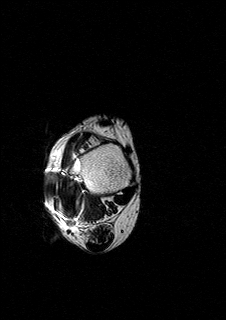
[im 40/40]
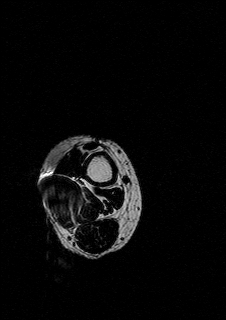

[Series 7: T2 fat-sat · coronal · 3.0mm · 0.70mm/px · 6 of 45 slices shown (2 of 3)]
[im 1/45]
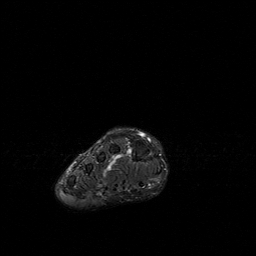
[im 9/45]
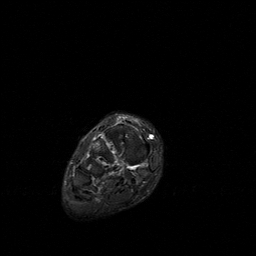
[im 18/45]
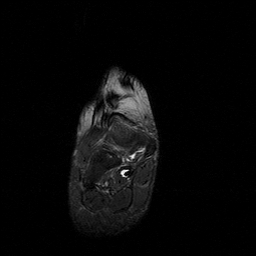
[im 27/45]
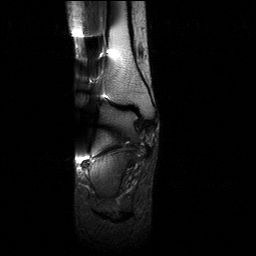
[im 36/45]
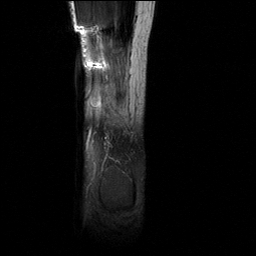
[im 45/45]
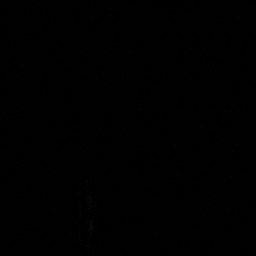

[Series 9: T2 fat-sat · sagittal · 3.0mm · 0.56mm/px · 4 of 29 slices shown (3 of 3)]
[im 1/29]
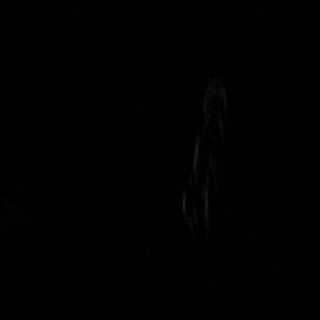
[im 10/29]
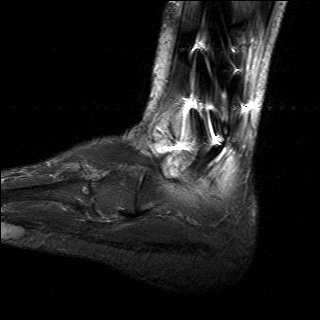
[im 19/29]
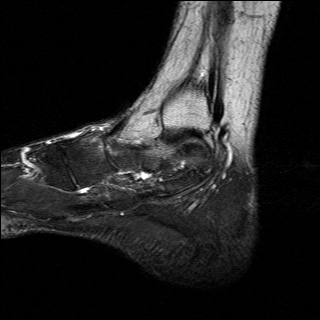
[im 29/29]
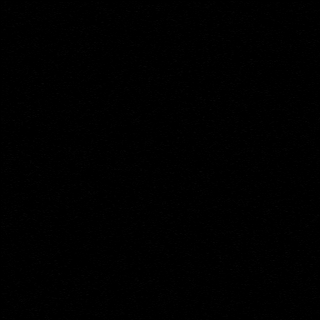

[Series 10: STIR · axial · 3.0mm · 0.62mm/px · z∈[-109,+31]mm · 5 of 40 slices shown (1 of 3)]
[im 1/40]
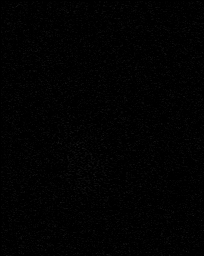
[im 10/40]
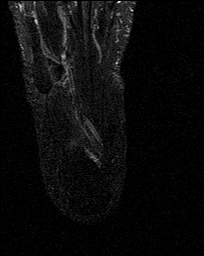
[im 20/40]
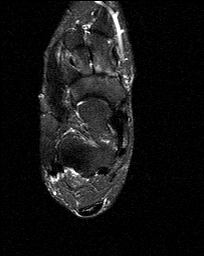
[im 30/40]
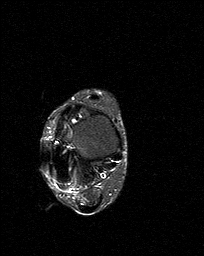
[im 40/40]
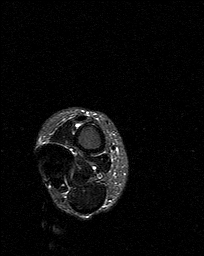

[Series 11: STIR · coronal · 3.0mm · 0.70mm/px · 6 of 45 slices shown (2 of 3)]
[im 1/45]
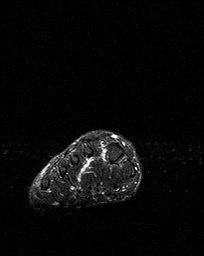
[im 9/45]
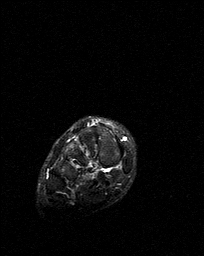
[im 18/45]
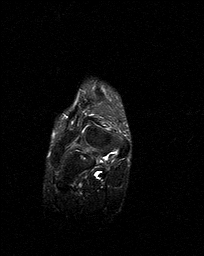
[im 27/45]
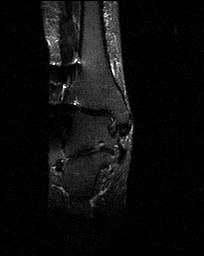
[im 36/45]
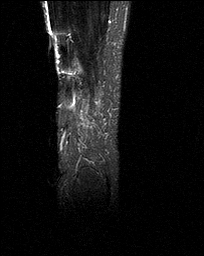
[im 45/45]
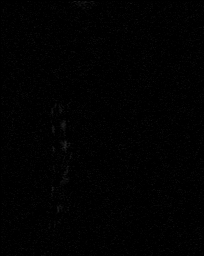

[Series 12: STIR · sagittal · 3.0mm · 0.70mm/px · 4 of 29 slices shown (3 of 3)]
[im 1/29]
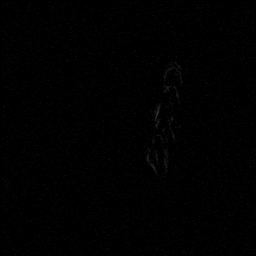
[im 10/29]
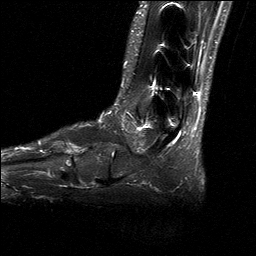
[im 19/29]
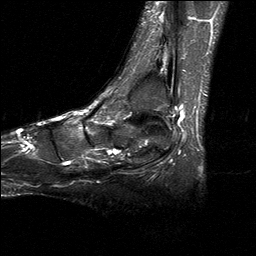
[im 29/29]
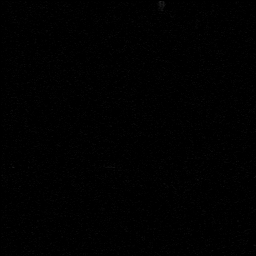

[Series 13: T1 · sagittal · 3.0mm · 0.56mm/px · 4 of 29 slices shown]
[im 1/29]
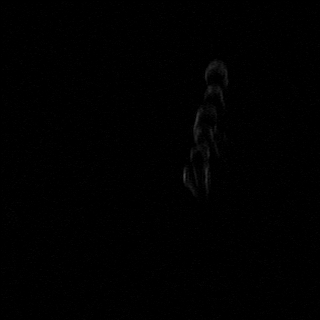
[im 10/29]
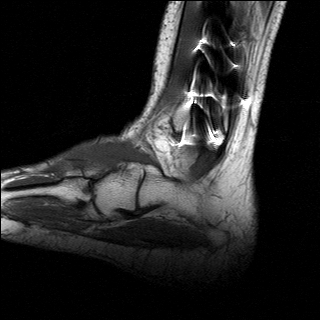
[im 19/29]
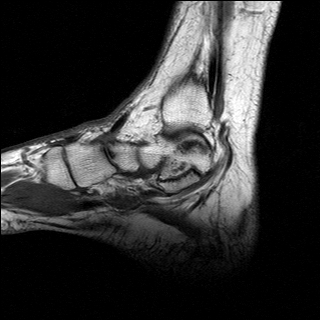
[im 29/29]
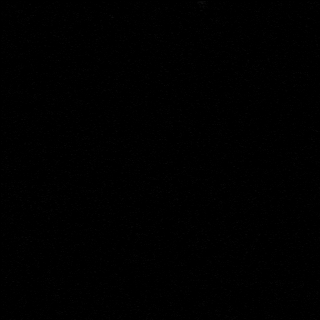

[40 of 40 positions shown; findings below may reference images not displayed]

FINDINGS: Distal lateral fibular sideplate with susceptibility artifact
obscuring the adjacent soft tissue and osseous structures.

TENDONS

Peroneal: Peroneal longus tendon intact. Peroneal brevis intact.

Posteromedial: Posterior tibial tendon intact. Flexor hallucis
longus tendon intact. Flexor digitorum longus tendon intact.

Anterior: Tibialis anterior tendon intact. Extensor hallucis longus
tendon intact Extensor digitorum longus tendon intact.

Achilles:  Intact.

Plantar Fascia: Intact.

LIGAMENTS

Lateral: Anterior and posterior talofibular ligaments are obscured
by susceptibility artifact resulting from orthopedic hardware.
Anterior and posterior tibiofibular ligaments intact.

Medial: Deltoid ligament intact. Spring ligament intact.

CARTILAGE

Ankle Joint: No joint effusion. Partial-thickness cartilage loss of
the posterior tibiotalar joint.

Subtalar Joints/Sinus Tarsi: Normal subtalar joints. No subtalar
joint effusion. Normal sinus tarsi.

Bones: Mild osteoarthritis of the navicular-medial cuneiform with
subchondral reactive marrow changes. Moderate osteoarthritis of the
third tarsometatarsal joint with subchondral reactive marrow
changes.

Soft Tissue: No soft tissue abnormality. No fluid collection or
hematoma.
IMPRESSION: 1. Mild osteoarthritis of the navicular-medial cuneiform with
subchondral reactive marrow changes.
2. Moderate osteoarthritis of the third tarsometatarsal joint with
subchondral reactive marrow changes.
3. Partial-thickness cartilage loss of the posterior tibiotalar
joint.

## 2019-12-22 NOTE — Progress Notes (Signed)
[facility] Note  12/23/2019     CHIEF COMPLAINT Patient presents for Retina Follow Up   HISTORY OF PRESENT ILLNESS: Christy Cardenas is a 74 y.o. female who presents to the clinic today for:   HPI    Retina Follow Up    Patient presents with  Other.  In both eyes.  This started 4 months ago.  Severity is mild.  Duration of 4 months.  Since onset it is stable.  I, the attending physician,  performed the HPI with the patient and updated documentation appropriately.          Comments    74 y/o female pt here for 4 mo f/u for ERM OU.  No change in [place] OU.  Denies pain, FOL, floaters.  Systane prn OU.       Last edited by Bernarda Caffey, MD on 12/23/2019  1:16 PM. (History)    pt states she feel like she can see "a lot better now", her eyes have been itching and she has been using Systane for relief   Referring physician: Dion Body, MD [facility] [place],  [place] [address]  HISTORICAL INFORMATION:   Selected notes from the [facility] Eval per Dr. Lucita Ferrara Pseudo OU; VMA OD [facility] [place]: OD: 20/20-2 OS: 20/70- PH: 20/40- Mrx: OD: Pl sph 20/20-2         OS: -1.00 sph 20/40- IOPS: 16, 15   CURRENT MEDICATIONS: No current outpatient medications on file. (Ophthalmic Drugs)   No current facility-administered medications for this visit. (Ophthalmic Drugs)   Current Outpatient Medications (Other)  Medication Sig  . alendronate (FOSAMAX) 70 MG tablet Take 70 mg by mouth once a week.  Marland Kitchen amLODipine (NORVASC) 2.5 MG tablet Take 2.5 mg by mouth every evening.  . Calcium Carb-Cholecalciferol (CALCIUM 500 +D PO) Take 1 tablet by mouth 2 (two) times daily.   . calcium carbonate (OSCAL) 1500 (600 Ca) MG TABS tablet Take by mouth 2 (two) times daily with a meal.  . Cinnamon 500 MG capsule Take by mouth daily.  Marland Kitchen escitalopram (LEXAPRO) 10 MG tablet Take 10 mg by mouth at bedtime.   . fluticasone (FLONASE) 50  MCG/ACT nasal spray SPRAY TWICE IN EACH NOSTRIL ONCE DAILY  . FLUZONE HIGH-DOSE 0.5 ML injection TO BE ADMINISTERED BY PHARMACIST FOR IMMUNIZATION  . Glucosamine 500 MG CAPS Take by mouth 2 (two) times daily.  Marland Kitchen lovastatin (MEVACOR) 40 MG tablet   . meloxicam (MOBIC) 15 MG tablet Take 1 tablet (15 mg total) by mouth daily.  . Multiple Vitamin (MULTIVITAMIN WITH MINERALS) TABS Take 1 tablet by mouth daily.  . naproxen sodium (ANAPROX) 220 MG tablet Take 220 mg by mouth 2 (two) times daily as needed. For pain  . ranitidine (ZANTAC) 150 MG tablet Take 150 mg by mouth as needed for heartburn.  . Red Yeast Rice Extract 600 MG TABS Take 600 mg by mouth daily.  . Trospium Chloride 60 MG CP24 Take 1 capsule by mouth every evening.  . vitamin E 180 MG (400 UNITS) capsule Take by mouth daily.  Marland Kitchen albuterol (PROAIR HFA) 108 (90 Base) MCG/ACT inhaler Inhale into the lungs.   No current facility-administered medications for this visit. (Other)      REVIEW OF SYSTEMS: ROS    Positive for: Gastrointestinal, Eyes   Negative for: Constitutional, Neurological, Skin, Genitourinary, Musculoskeletal, HENT, Endocrine, Cardiovascular, Respiratory, Psychiatric, Allergic/Imm, Heme/Lymph   Last edited  by Matthew Folks, COA on 12/23/2019  1:01 PM. (History)       ALLERGIES No Known Allergies  PAST MEDICAL HISTORY Past Medical History:  Diagnosis Date  . Anxiety   . Bladder cancer [facility]) urologist-  dr eskridge   hx BCG tx's  . Depression   . GERD (gastroesophageal reflux disease)   . Hypertension   . Hypertensive retinopathy    OU  . Urinary bladder neoplasm    Past Surgical History:  Procedure Laterality Date  . CATARACT EXTRACTION Bilateral 2016   Dr. Lucita Ferrara  . CATARACT EXTRACTION W/ INTRAOCULAR LENS  IMPLANT, BILATERAL  2016  . CYSTO/  BLADDER TUMOR BX'S AND FULGERATION OF TUMOR'S  01-28-2006  . CYSTO/  URETHRAL DILATION/  BILATERAL RETROGRADE PYELOGRAM/  RESECTION BLADDER TUMORS   03-13-2005  . CYSTOSCOPY WITH BIOPSY  07/31/2011   Procedure: CYSTOSCOPY WITH BIOPSY;  Surgeon: Fredricka Bonine, MD;  Location: [facility];  Service: Urology;  Laterality: N/A;  BLADDER BIOPSY  . CYSTOSCOPY WITH BIOPSY  08/15/2012   Procedure: CYSTOSCOPY WITH BIOPSY;  Surgeon: Fredricka Bonine, MD;  Location: [facility];  Service: Urology;  Laterality: N/A;  FULGURATION  . CYSTOSCOPY WITH BIOPSY N/A 07/25/2015   Procedure: CYSTOSCOPY WITH BLADDER  BIOPSY FULGERATION;  Surgeon: Festus Aloe, MD;  Location[facility];  Service: Urology;  Laterality: N/A;  . CYSTOSCOPY/RETROGRADE/URETEROSCOPY  07/31/2011   Procedure: CYSTOSCOPY/RETROGRADE/URETEROSCOPY;  Surgeon: Fredricka Bonine, MD;  Location: [facility];  Service: Urology;  Laterality: Bilateral;  CYSTOSCOPY BILATERAL RETROGRADE PYELOGRAM URETEROSCOPY Right ureteral stent placement  . EYE SURGERY Bilateral 2016   Cat Sx - Dr. Lucita Ferrara  . ORIF RIGHT ANKLE FX  08/ 2012   [facility]  . TONSILLECTOMY  as child  . TRANSURETHRAL RESECTION OF BLADDER TUMOR WITH MITOMYCIN-C  07-04-2010  . YAG LASER APPLICATION Bilateral 6834   Dr. Lucita Ferrara    FAMILY HISTORY History reviewed. No pertinent family history.  SOCIAL HISTORY Social History   Tobacco Use  . Smoking status: Never Smoker  . Smokeless tobacco: Never Used  Substance Use Topics  . Alcohol use: Yes    Comment: socially  . Drug use: No         OPHTHALMIC EXAM:  Base Eye Exam    Visual Acuity (Snellen - Linear)      Right Left   Dist [place] 20/15 -2    Near [place]  J1+  [facility]       Tonometry (Tonopen, 1:03 PM)      Right Left   Pressure 14 15       Pupils      Dark Light Shape React APD   Right 4 3 Round Brisk None   Left 4 3 Round Brisk None       Visual Fields (Counting fingers)      Left Right    Full Full       Extraocular Movement      Right Left    Full, Ortho Full, Ortho       Neuro/Psych    Oriented x3:  Yes   Mood/Affect: Normal       Dilation    Both eyes: 1.0% Mydriacyl, 2.5% Phenylephrine @ 1:03 PM        Slit Lamp and Fundus Exam    Slit Lamp Exam      Right Left   Lids/Lashes Dermatochalasis - upper lid Dermatochalasis - upper lid   Conjunctiva/Sclera White and quiet White  and quiet   Cornea 1+ inferior Punctate epithelial erosions, well healed temporal cataract wounds, Arcus 1+ inferior Punctate epithelial erosions, well healed temporal cataract wounds, mild Arcus   Anterior Chamber Deep and quiet Deep and quiet   Iris Round and dilated Round and dilated   Lens PC IOL in good position with open PC PC IOL in good position with open PC   Vitreous Vitreous syneresis Vitreous syneresis       Fundus Exam      Right Left   Disc Pink and Sharp, mild tilt, temporal Peripapillary atrophy, Compact Pink and Sharp, mild tilt, temporal Peripapillary atrophy   C/D Ratio 0.4 0.5   Macula Flat, Blunted foveal reflex, Epiretinal membrane, No heme or edema Flat, Blunted foveal reflex, RPE mottling and focal clump nasal to fovea, mild Epiretinal membrane, No heme or edema   Vessels Mild Vascular attenuation, mild Tortuousity Vascular attenuation, mild Tortuousity, mild AV crossing changes   Periphery Attached, no RT/RD Attached, no RT/RD          IMAGING AND PROCEDURES  Imaging and Procedures for '@TODAY'$ @  OCT, Retina - OU - Both Eyes       Right Eye Quality was good. Central Foveal Thickness: 270. Progression has been stable. Findings include no IRF, no SRF, epiretinal membrane, macular pucker, normal foveal contour (Stable from prior).   Left Eye Quality was good. Central Foveal Thickness: 251. Progression has been stable. Findings include normal foveal contour, no IRF, no SRF, retinal drusen , epiretinal membrane (Focal druse, mild ERM).   Notes *Images captured and stored on drive  Diagnosis / Impression:  NFP, no IRF/SRF OU OD: ERM w/ mild pucker -- stable OS: focal druse,  mild ERM -- stable  Clinical management:  See below  Abbreviations: NFP - Normal foveal profile. CME - cystoid macular edema. PED - pigment epithelial detachment. IRF - intraretinal fluid. SRF - subretinal fluid. EZ - ellipsoid zone. ERM - epiretinal membrane. ORA - outer retinal atrophy. ORT - outer retinal tubulation. SRHM - subretinal hyper-reflective material                 ASSESSMENT/PLAN:    ICD-10-CM   1. Epiretinal membrane (ERM) of both eyes  H35.373   2. Retinal edema  H35.81 OCT, Retina - OU - Both Eyes  3. Essential hypertension  I10   4. Hypertensive retinopathy of both eyes  H35.033   5. Pseudophakia of both eyes  Z96.1     1,2. Epiretinal membrane, OU  - mild ERM OU (OD > OS; OD w/ mild pucker) -- stable  - asymptomatic, BCVA 20/15 OD, J1+ OS (monovision)  - no metamorphopsia  - no indication for surgery at this time  - monitor  - f/u 6-9 mos  3,4. Hypertensive retinopathy OU  - discussed importance of tight BP control  - monitor  5. Pseudophakia OU  - s/p CE/IOL OU (Dr. Lucita Ferrara, 2016)  - s/p YAG cap OU (Dr. Lucita Ferrara Dec 2020)  - beautiful surgeries, doing well    Ophthalmic Meds Ordered this visit:  No orders of the defined types were placed in this encounter.      Return f/u 6-9 months, ERM OU, for DFE, OCT.  There are no Patient Instructions on file for this visit.  This document serves as a record of services personally performed by Gardiner Sleeper, MD, PhD. It was created on their behalf by Ernest Mallick, OA, an ophthalmic assistant. The creation of this record is  the provider's dictation and/or activities during the visit.    Electronically signed by: Ernest Mallick, OA 05.12.2021 1:31 PM  Gardiner Sleeper, M.D., Ph.D. Diseases & Surgery of the Retina and [place] 12/23/2019   I have reviewed the above documentation for accuracy and completeness, and I agree with the above. Gardiner Sleeper,  M.D., Ph.D. 12/23/19 1:31 PM    Abbreviations: M myopia (nearsighted); A astigmatism; H hyperopia (farsighted); P presbyopia; Mrx spectacle prescription;  CTL contact lenses; OD right eye; OS left eye; OU both eyes  XT exotropia; ET esotropia; PEK punctate epithelial keratitis; PEE punctate epithelial erosions; DES dry eye syndrome; MGD meibomian gland dysfunction; ATs artificial tears; PFAT's preservative free artificial tears; [place] nuclear sclerotic cataract; PSC posterior subcapsular cataract; ERM epi-retinal membrane; PVD posterior vitreous detachment; RD retinal detachment; DM diabetes mellitus; DR diabetic retinopathy; NPDR non-proliferative diabetic retinopathy; PDR proliferative diabetic retinopathy; CSME clinically significant macular edema; DME diabetic macular edema; dbh dot blot hemorrhages; CWS cotton wool spot; POAG primary open angle glaucoma; C/D cup-to-disc ratio; HVF humphrey visual field; GVF goldmann visual field; OCT optical coherence tomography; IOP intraocular pressure; BRVO Branch retinal vein occlusion; CRVO central retinal vein occlusion; CRAO central retinal artery occlusion; BRAO branch retinal artery occlusion; RT retinal tear; SB scleral buckle; PPV pars plana vitrectomy; VH Vitreous hemorrhage; PRP panretinal laser photocoagulation; IVK intravitreal kenalog; VMT vitreomacular traction; MH Macular hole;  NVD neovascularization of the disc; NVE neovascularization elsewhere; AREDS age related eye disease study; ARMD age related macular degeneration; POAG primary open angle glaucoma; EBMD epithelial/anterior basement membrane dystrophy; ACIOL anterior chamber intraocular lens; IOL intraocular lens; PCIOL posterior chamber intraocular lens; Phaco/IOL phacoemulsification with intraocular lens placement; [place] photorefractive keratectomy; LASIK laser assisted in situ keratomileusis; HTN hypertension; DM diabetes mellitus; COPD chronic obstructive pulmonary disease

## 2019-12-23 ENCOUNTER — Encounter (INDEPENDENT_AMBULATORY_CARE_PROVIDER_SITE_OTHER): Payer: Self-pay | Admitting: Ophthalmology

## 2019-12-23 ENCOUNTER — Other Ambulatory Visit: Payer: Self-pay

## 2019-12-23 ENCOUNTER — Ambulatory Visit (INDEPENDENT_AMBULATORY_CARE_PROVIDER_SITE_OTHER): Payer: Medicare PPO | Admitting: Ophthalmology

## 2019-12-23 DIAGNOSIS — H35033 Hypertensive retinopathy, bilateral: Secondary | ICD-10-CM | POA: Diagnosis not present

## 2019-12-23 DIAGNOSIS — H35373 Puckering of macula, bilateral: Secondary | ICD-10-CM | POA: Diagnosis not present

## 2019-12-23 DIAGNOSIS — H3581 Retinal edema: Secondary | ICD-10-CM | POA: Diagnosis not present

## 2019-12-23 DIAGNOSIS — I10 Essential (primary) hypertension: Secondary | ICD-10-CM

## 2019-12-23 DIAGNOSIS — Z961 Presence of intraocular lens: Secondary | ICD-10-CM

## 2020-01-25 ENCOUNTER — Ambulatory Visit
Admission: RE | Admit: 2020-01-25 | Discharge: 2020-01-25 | Disposition: A | Discharge status: Home / Self Care | Payer: Medicare PPO | Source: Ambulatory Visit | Attending: Family Medicine | Admitting: Family Medicine

## 2020-01-25 DIAGNOSIS — Z1231 Encounter for screening mammogram for malignant neoplasm of breast: Secondary | ICD-10-CM | POA: Insufficient documentation

## 2020-09-15 NOTE — Progress Notes (Signed)
[facility] Note  09/20/2020     CHIEF COMPLAINT Patient presents for Retina Follow Up   HISTORY OF PRESENT ILLNESS: Christy Cardenas is a 75 y.o. female who presents to the clinic today for:   HPI    Retina Follow Up    Patient presents with  Other.  In both eyes.  This started weeks ago.  Severity is moderate.  Duration of weeks.  Since onset it is stable.  I, the attending physician,  performed the HPI with the patient and updated documentation appropriately.          Comments    Pt states vision is stable OU.  Pt denies eye pain or discomfort and denies any new or worsening floaters or fol OU.       Last edited by Bernarda Caffey, MD on 09/20/2020  1:19 PM. (History)    pt states no problems with vision, her eyes water a little bit   Referring physician: Dion Body, [place] [facility] [place],  [place] [address]  HISTORICAL INFORMATION:   Selected notes from the [facility] per Dr. Lucita Ferrara Pseudo OU; VMA OD   CURRENT MEDICATIONS: No current outpatient medications on file. (Ophthalmic Drugs)   No current facility-administered medications for this visit. (Ophthalmic Drugs)   Current Outpatient Medications (Other)  Medication Sig  . albuterol (PROAIR HFA) 108 (90 Base) MCG/ACT inhaler Inhale into the lungs.  Marland Kitchen amLODipine (NORVASC) 2.5 MG tablet Take 2.5 mg by mouth every evening.  . Calcium Carb-Cholecalciferol (CALCIUM 500 +D PO) Take 1 tablet by mouth 2 (two) times daily.   . calcium carbonate (OSCAL) 1500 (600 Ca) MG TABS tablet Take by mouth 2 (two) times daily with a meal.  . Cinnamon 500 MG capsule Take by mouth daily.  Marland Kitchen escitalopram (LEXAPRO) 10 MG tablet Take 10 mg by mouth at bedtime.   . fluticasone (FLONASE) 50 MCG/ACT nasal spray SPRAY TWICE IN EACH NOSTRIL ONCE DAILY  . FLUZONE HIGH-DOSE 0.5 ML injection TO BE ADMINISTERED BY PHARMACIST FOR IMMUNIZATION  . Glucosamine 500  MG CAPS Take by mouth 2 (two) times daily.  Marland Kitchen lovastatin (MEVACOR) 40 MG tablet   . meloxicam (MOBIC) 15 MG tablet Take 1 tablet (15 mg total) by mouth daily.  . Multiple Vitamin (MULTIVITAMIN WITH MINERALS) TABS Take 1 tablet by mouth daily.  . naproxen sodium (ANAPROX) 220 MG tablet Take 220 mg by mouth 2 (two) times daily as needed. For pain  . ranitidine (ZANTAC) 150 MG tablet Take 150 mg by mouth as needed for heartburn.  . Red Yeast Rice Extract 600 MG TABS Take 600 mg by mouth daily.  . Trospium Chloride 60 MG CP24 Take 1 capsule by mouth every evening.  . vitamin E 180 MG (400 UNITS) capsule Take by mouth daily.   No current facility-administered medications for this visit. (Other)      REVIEW OF SYSTEMS: ROS    Positive for: Gastrointestinal, Eyes   Negative for: Constitutional, Neurological, Skin, Genitourinary, Musculoskeletal, HENT, Endocrine, Cardiovascular, Respiratory, Psychiatric, Allergic/Imm, Heme/Lymph   Last edited by Doneen Poisson on 09/20/2020  1:08 PM. (History)       ALLERGIES No Known Allergies  PAST MEDICAL HISTORY Past Medical History:  Diagnosis Date  . Anxiety   . Bladder cancer [facility]) urologist-  dr eskridge   hx BCG tx's  . Depression   . GERD (gastroesophageal reflux disease)   . Hypertension   .  Hypertensive retinopathy    OU  . Urinary bladder neoplasm    Past Surgical History:  Procedure Laterality Date  . CATARACT EXTRACTION Bilateral 2016   Dr. Lucita Ferrara  . CATARACT EXTRACTION W/ INTRAOCULAR LENS  IMPLANT, BILATERAL  2016  . CYSTO/  BLADDER TUMOR BX'S AND FULGERATION OF TUMOR'S  01-28-2006  . CYSTO/  URETHRAL DILATION/  BILATERAL RETROGRADE PYELOGRAM/  RESECTION BLADDER TUMORS  03-13-2005  . CYSTOSCOPY WITH BIOPSY  07/31/2011   Procedure: CYSTOSCOPY WITH BIOPSY;  Surgeon: Fredricka Bonine, MD;  Location: [facility];  Service: Urology;  Laterality: N/A;  BLADDER BIOPSY  . CYSTOSCOPY WITH BIOPSY  08/15/2012   Procedure:  CYSTOSCOPY WITH BIOPSY;  Surgeon: Fredricka Bonine, MD;  [facility];  Service: Urology;  Laterality: N/A;  FULGURATION  . CYSTOSCOPY WITH BIOPSY N/A 07/25/2015   Procedure: CYSTOSCOPY WITH BLADDER  BIOPSY FULGERATION;  Surgeon: Festus Aloe, MD;  Location: [facility];  Service: Urology;  Laterality: N/A;  . CYSTOSCOPY/RETROGRADE/URETEROSCOPY  07/31/2011   Procedure: CYSTOSCOPY/RETROGRADE/URETEROSCOPY;  Surgeon: Fredricka Bonine, MD;  Location: [facility];  Service: Urology;  Laterality: Bilateral;  CYSTOSCOPY BILATERAL RETROGRADE PYELOGRAM URETEROSCOPY Right ureteral stent placement  . EYE SURGERY Bilateral 2016   Cat Sx - Dr. Lucita Ferrara  . ORIF RIGHT ANKLE FX  08/ 2012   [facility]  . TONSILLECTOMY  as child  . TRANSURETHRAL RESECTION OF BLADDER TUMOR WITH MITOMYCIN-C  07-04-2010  . YAG LASER APPLICATION Bilateral 0960   Dr. Lucita Ferrara    FAMILY HISTORY History reviewed. No pertinent family history.  SOCIAL HISTORY Social History   Tobacco Use  . Smoking status: Never Smoker  . Smokeless tobacco: Never Used  Substance Use Topics  . Alcohol use: Yes    Comment: socially  . Drug use: No         OPHTHALMIC EXAM:  Base Eye Exam    Visual Acuity (Snellen - Linear)      Right Left   Dist [place] 20/20    Near [place]  20/20  Mono vision.  Near OS       Tonometry (Tonopen, 1:11 PM)      Right Left   Pressure 22 22       Pupils      Dark Light Shape React APD   Right 5 4 Round Brisk 0   Left 5 4 Round Brisk 0       Visual Fields      Left Right    Full Full       Extraocular Movement      Right Left    Full Full       Neuro/Psych    Oriented x3: Yes   Mood/Affect: Normal       Dilation    Both eyes: 1.0% Mydriacyl, 2.5% Phenylephrine @ 1:11 PM        Slit Lamp and Fundus Exam    Slit Lamp Exam      Right Left   Lids/Lashes Dermatochalasis - upper lid Dermatochalasis - upper lid   Conjunctiva/Sclera  White and quiet White and quiet   Cornea 1+ inferior Punctate epithelial erosions, well healed temporal cataract wounds, Arcus 1+ inferior Punctate epithelial erosions, well healed temporal cataract wounds, mild Arcus   Anterior Chamber Deep and quiet Deep and quiet   Iris Round and dilated Round and dilated   Lens PC IOL in good position with open PC PC IOL in good position with open PC  Vitreous Vitreous syneresis Vitreous syneresis       Fundus Exam      Right Left   Disc Pink and Sharp, mild tilt, temporal Peripapillary atrophy, Compact Pink and Sharp, mild tilt, temporal Peripapillary atrophy   C/D Ratio 0.3 0.5   Macula Flat, Blunted foveal reflex, Epiretinal membrane, No heme or edema Flat, Blunted foveal reflex, RPE mottling and focal clump nasal to fovea, mild Epiretinal membrane, No heme or edema   Vessels Mild Vascular attenuation, mild Tortuousity Vascular attenuation, mild Tortuousity   Periphery Attached, no RT/RD Attached, no RT/RD          IMAGING AND PROCEDURES  Imaging and Procedures for @TODAY @  OCT, Retina - OU - Both Eyes       Right Eye Quality was good. Central Foveal Thickness: 266. Progression has been stable. Findings include no IRF, no SRF, epiretinal membrane, macular pucker, normal foveal contour (Trace progression of ERM).   Left Eye Quality was good. Central Foveal Thickness: 252. Progression has been stable. Findings include normal foveal contour, no IRF, no SRF, retinal drusen , epiretinal membrane (Focal druse, mild ERM).   Notes *Images captured and stored on drive  Diagnosis / Impression:  NFP, no IRF/SRF OU OD: ERM w/ mild pucker -- stable OS: focal druse, mild ERM -- stable  Clinical management:  See below  Abbreviations: NFP - Normal foveal profile. CME - cystoid macular edema. PED - pigment epithelial detachment. IRF - intraretinal fluid. SRF - subretinal fluid. EZ - ellipsoid zone. ERM - epiretinal membrane. ORA - outer retinal  atrophy. ORT - outer retinal tubulation. SRHM - subretinal hyper-reflective material                 ASSESSMENT/PLAN:    ICD-10-CM   1. Epiretinal membrane (ERM) of both eyes  H35.373   2. Retinal edema  H35.81 OCT, Retina - OU - Both Eyes  3. Essential hypertension  I10   4. Hypertensive retinopathy of both eyes  H35.033   5. Pseudophakia of both eyes  Z96.1     1,2. Epiretinal membrane, OU  - mild ERM OU (OD > OS; OD w/ mild pucker) -- stable  - asymptomatic, BCVA 20/20 OD, J1+ OS (monovision)  - no metamorphopsia  - no indication for surgery at this time  - monitor  - f/u 1 year, DFE, OCT  3,4. Hypertensive retinopathy OU  - discussed importance of tight BP control  - monitor  5. Pseudophakia OU  - s/p CE/IOL OU (Dr. 12-04-2001, 2016)  - s/p YAG cap OU (Dr. 2017 Dec 2020)  - beautiful surgeries, doing well  Ophthalmic Meds Ordered this visit:  No orders of the defined types were placed in this encounter.     Return in about 1 year (around 09/20/2021) for f/u ERM OU, DFE, OCT.  There are no Patient Instructions on file for this visit.  This document serves as a record of services personally performed by 11/18/2021, MD, PhD. It was created on their behalf by Karie Chimera, COT an ophthalmic technician. The creation of this record is the provider's dictation and/or activities during the visit.    Electronically signed by: Cristopher Estimable, COT 2.3.22 @ 1:52 PM   This document serves as a record of services personally performed by 4.3.22, MD, PhD. It was created on their behalf by Karie Chimera. Glee Arvin, OA an ophthalmic technician. The creation of this record is the provider's dictation and/or activities during the visit.  Electronically signed by: San Jetty. Owens Shark, [place] 02.08.2022 1:52 PM  Gardiner Sleeper, M.D., Ph.D. Diseases & Surgery of the Retina and [place] 09/20/2020    I have reviewed the above  documentation for accuracy and completeness, and I agree with the above. Gardiner Sleeper, M.D., Ph.D. 09/20/20 1:52 PM   Abbreviations: M myopia (nearsighted); A astigmatism; H hyperopia (farsighted); P presbyopia; Mrx spectacle prescription;  CTL contact lenses; OD right eye; OS left eye; OU both eyes  XT exotropia; ET esotropia; PEK punctate epithelial keratitis; PEE punctate epithelial erosions; DES dry eye syndrome; MGD meibomian gland dysfunction; ATs artificial tears; PFAT's preservative free artificial tears; [place] nuclear sclerotic cataract; PSC posterior subcapsular cataract; ERM epi-retinal membrane; PVD posterior vitreous detachment; RD retinal detachment; DM diabetes mellitus; DR diabetic retinopathy; NPDR non-proliferative diabetic retinopathy; PDR proliferative diabetic retinopathy; CSME clinically significant macular edema; DME diabetic macular edema; dbh dot blot hemorrhages; CWS cotton wool spot; POAG primary open angle glaucoma; C/D cup-to-disc ratio; HVF humphrey visual field; GVF goldmann visual field; OCT optical coherence tomography; IOP intraocular pressure; BRVO Branch retinal vein occlusion; CRVO central retinal vein occlusion; CRAO central retinal artery occlusion; BRAO branch retinal artery occlusion; RT retinal tear; SB scleral buckle; PPV pars plana vitrectomy; VH Vitreous hemorrhage; PRP panretinal laser photocoagulation; IVK intravitreal kenalog; VMT vitreomacular traction; MH Macular hole;  NVD neovascularization of the disc; NVE neovascularization elsewhere; AREDS age related eye disease study; ARMD age related macular degeneration; POAG primary open angle glaucoma; EBMD epithelial/anterior basement membrane dystrophy; ACIOL anterior chamber intraocular lens; IOL intraocular lens; PCIOL posterior chamber intraocular lens; Phaco/IOL phacoemulsification with intraocular lens placement; [place] photorefractive keratectomy; LASIK laser assisted in situ keratomileusis; HTN hypertension; DM  diabetes mellitus; COPD chronic obstructive pulmonary disease

## 2020-09-20 ENCOUNTER — Encounter (INDEPENDENT_AMBULATORY_CARE_PROVIDER_SITE_OTHER): Payer: Self-pay | Admitting: Ophthalmology

## 2020-09-20 ENCOUNTER — Other Ambulatory Visit: Payer: Self-pay

## 2020-09-20 ENCOUNTER — Ambulatory Visit (INDEPENDENT_AMBULATORY_CARE_PROVIDER_SITE_OTHER): Payer: Medicare PPO | Admitting: Ophthalmology

## 2020-09-20 DIAGNOSIS — I10 Essential (primary) hypertension: Secondary | ICD-10-CM

## 2020-09-20 DIAGNOSIS — H3581 Retinal edema: Secondary | ICD-10-CM | POA: Diagnosis not present

## 2020-09-20 DIAGNOSIS — H35033 Hypertensive retinopathy, bilateral: Secondary | ICD-10-CM

## 2020-09-20 DIAGNOSIS — H35373 Puckering of macula, bilateral: Secondary | ICD-10-CM | POA: Diagnosis not present

## 2020-09-20 DIAGNOSIS — Z961 Presence of intraocular lens: Secondary | ICD-10-CM

## 2021-07-03 ENCOUNTER — Other Ambulatory Visit: Payer: Self-pay | Admitting: Urology

## 2021-08-08 ENCOUNTER — Other Ambulatory Visit: Payer: Self-pay

## 2021-08-08 ENCOUNTER — Encounter (HOSPITAL_BASED_OUTPATIENT_CLINIC_OR_DEPARTMENT_OTHER): Payer: Self-pay | Admitting: Urology

## 2021-08-08 NOTE — Progress Notes (Signed)
Spoke w/ via phone for pre-op interview--- Christy Cardenas----     EKG, ISTAT          Lab results------ COVID test -----patient states asymptomatic no test needed Arrive at ------- 0530 NPO after MN NO Solid Food.  Clear liquids from MN until--- Med rec completed Medications to take morning of surgery ----- Prilosec Diabetic medication ----- Patient instructed no nail polish to be worn day of surgery Patient instructed to bring photo id and insurance card day of surgery Patient aware to have Driver (ride ) / caregiver    for 24 hours after surgery  Patient Special Instructions ----- Pre-Op special Istructions ----- Patient verbalized understanding of instructions that were given at this phone interview. Patient denies shortness of breath, chest pain, fever, cough at this phone interview.

## 2021-08-13 NOTE — Anesthesia Preprocedure Evaluation (Addendum)
Anesthesia Evaluation  Patient identified by MRN, date of birth, ID band Patient awake    Reviewed: Allergy & Precautions, NPO status , Patient's Chart, lab work & pertinent test results  Airway Mallampati: II  TM Distance: >3 FB Neck ROM: Full    Dental no notable dental hx. (+) Teeth Intact, Dental Advisory Given   Pulmonary neg pulmonary ROS,    Pulmonary exam normal breath sounds clear to auscultation       Cardiovascular hypertension, Pt. on medications Normal cardiovascular exam Rhythm:Regular Rate:Normal     Neuro/Psych Anxiety Depression negative neurological ROS     GI/Hepatic Neg liver ROS, GERD  Medicated,  Endo/Other  negative endocrine ROS  Renal/GU Renal diseaseK+ 4.4  Cr 0.9     Musculoskeletal   Abdominal (+) + obese (BMI 32.77),   Peds  Hematology Hgb 13.6   Anesthesia Other Findings NKA  Reproductive/Obstetrics                           Anesthesia Physical Anesthesia Plan  ASA: 2  Anesthesia Plan: General   Post-op Pain Management: Tylenol PO (pre-op)   Induction: Intravenous  PONV Risk Score and Plan: 4 or greater and Treatment may vary due to age or medical condition, Ondansetron and Midazolam  Airway Management Planned: LMA  Additional Equipment: None  Intra-op Plan:   Post-operative Plan:   Informed Consent: I have reviewed the patients History and Physical, chart, labs and discussed the procedure including the risks, benefits and alternatives for the proposed anesthesia with the patient or authorized representative who has indicated his/her understanding and acceptance.     Dental advisory given  Plan Discussed with: CRNA  Anesthesia Plan Comments:        Anesthesia Quick Evaluation

## 2021-08-15 ENCOUNTER — Ambulatory Visit (HOSPITAL_BASED_OUTPATIENT_CLINIC_OR_DEPARTMENT_OTHER): Payer: Medicare PPO | Admitting: Anesthesiology

## 2021-08-15 ENCOUNTER — Encounter (HOSPITAL_BASED_OUTPATIENT_CLINIC_OR_DEPARTMENT_OTHER)
Admission: RE | Disposition: A | Discharge status: Home / Self Care | Payer: Self-pay | Source: Home / Self Care | Attending: Urology

## 2021-08-15 ENCOUNTER — Other Ambulatory Visit: Payer: Self-pay

## 2021-08-15 ENCOUNTER — Ambulatory Visit (HOSPITAL_BASED_OUTPATIENT_CLINIC_OR_DEPARTMENT_OTHER)
Admission: RE | Admit: 2021-08-15 | Discharge: 2021-08-15 | Disposition: A | Discharge status: Home / Self Care | Payer: Medicare PPO | Attending: Urology | Admitting: Urology

## 2021-08-15 ENCOUNTER — Encounter (HOSPITAL_BASED_OUTPATIENT_CLINIC_OR_DEPARTMENT_OTHER): Payer: Self-pay | Admitting: Urology

## 2021-08-15 DIAGNOSIS — F419 Anxiety disorder, unspecified: Secondary | ICD-10-CM | POA: Diagnosis not present

## 2021-08-15 DIAGNOSIS — N816 Rectocele: Secondary | ICD-10-CM | POA: Diagnosis not present

## 2021-08-15 DIAGNOSIS — Z79899 Other long term (current) drug therapy: Secondary | ICD-10-CM | POA: Diagnosis not present

## 2021-08-15 DIAGNOSIS — N289 Disorder of kidney and ureter, unspecified: Secondary | ICD-10-CM | POA: Insufficient documentation

## 2021-08-15 DIAGNOSIS — Z8551 Personal history of malignant neoplasm of bladder: Secondary | ICD-10-CM | POA: Diagnosis not present

## 2021-08-15 DIAGNOSIS — F32A Depression, unspecified: Secondary | ICD-10-CM | POA: Diagnosis not present

## 2021-08-15 DIAGNOSIS — K219 Gastro-esophageal reflux disease without esophagitis: Secondary | ICD-10-CM | POA: Diagnosis not present

## 2021-08-15 DIAGNOSIS — D494 Neoplasm of unspecified behavior of bladder: Secondary | ICD-10-CM | POA: Diagnosis present

## 2021-08-15 DIAGNOSIS — I10 Essential (primary) hypertension: Secondary | ICD-10-CM | POA: Insufficient documentation

## 2021-08-15 DIAGNOSIS — N3080 Other cystitis without hematuria: Secondary | ICD-10-CM | POA: Insufficient documentation

## 2021-08-15 DIAGNOSIS — D414 Neoplasm of uncertain behavior of bladder: Secondary | ICD-10-CM

## 2021-08-15 HISTORY — PX: CYSTOSCOPY WITH BIOPSY: SHX5122

## 2021-08-15 LAB — POCT I-STAT, CHEM 8
BUN: 22 mg/dL (ref 8–23)
Calcium, Ion: 1.24 mmol/L (ref 1.15–1.40)
Chloride: 105 mmol/L (ref 98–111)
Creatinine, Ser: 0.9 mg/dL (ref 0.44–1.00)
Glucose, Bld: 107 mg/dL — ABNORMAL HIGH (ref 70–99)
HCT: 40 % (ref 36.0–46.0)
Hemoglobin: 13.6 g/dL (ref 12.0–15.0)
Potassium: 4.4 mmol/L (ref 3.5–5.1)
Sodium: 141 mmol/L (ref 135–145)
TCO2: 23 mmol/L (ref 22–32)

## 2021-08-15 SURGERY — CYSTOSCOPY, WITH BIOPSY
Anesthesia: General | Site: Bladder

## 2021-08-15 MED ORDER — EPHEDRINE SULFATE 50 MG/ML IJ SOLN
INTRAMUSCULAR | Status: DC | PRN
  Administered 2021-08-15 (×2): 5 mg via INTRAVENOUS

## 2021-08-15 MED ORDER — CEFAZOLIN SODIUM-DEXTROSE 2-4 GM/100ML-% IV SOLN
INTRAVENOUS | Status: AC
  Filled 2021-08-15: qty 100

## 2021-08-15 MED ORDER — PROPOFOL 10 MG/ML IV BOLUS
INTRAVENOUS | Status: AC
  Filled 2021-08-15: qty 20

## 2021-08-15 MED ORDER — ONDANSETRON HCL 4 MG/2ML IJ SOLN
INTRAMUSCULAR | Status: DC | PRN
Start: 2021-08-15 — End: 2021-08-15
  Administered 2021-08-15: 4 mg via INTRAVENOUS

## 2021-08-15 MED ORDER — CEFAZOLIN SODIUM-DEXTROSE 2-4 GM/100ML-% IV SOLN
2.0000 g | Freq: Once | INTRAVENOUS | Status: AC
  Administered 2021-08-15: 2 g via INTRAVENOUS

## 2021-08-15 MED ORDER — PHENYLEPHRINE HCL (PRESSORS) 10 MG/ML IV SOLN
INTRAVENOUS | Status: DC | PRN
  Administered 2021-08-15 (×2): 80 ug via INTRAVENOUS

## 2021-08-15 MED ORDER — DEXAMETHASONE SODIUM PHOSPHATE 4 MG/ML IJ SOLN
INTRAMUSCULAR | Status: DC | PRN
  Administered 2021-08-15: 4 mg via INTRAVENOUS

## 2021-08-15 MED ORDER — LIDOCAINE HCL URETHRAL/MUCOSAL 2 % EX GEL
CUTANEOUS | Status: DC | PRN
  Administered 2021-08-15: 1

## 2021-08-15 MED ORDER — STERILE WATER FOR IRRIGATION IR SOLN
Status: DC | PRN
  Administered 2021-08-15: 3000 mL

## 2021-08-15 MED ORDER — LACTATED RINGERS IV SOLN: INTRAVENOUS | Status: DC

## 2021-08-15 MED ORDER — PHENYLEPHRINE 40 MCG/ML (10ML) SYRINGE FOR IV PUSH (FOR BLOOD PRESSURE SUPPORT)
PREFILLED_SYRINGE | INTRAVENOUS | Status: AC
  Filled 2021-08-15: qty 10

## 2021-08-15 MED ORDER — FENTANYL CITRATE (PF) 100 MCG/2ML IJ SOLN
INTRAMUSCULAR | Status: AC
  Filled 2021-08-15: qty 2

## 2021-08-15 MED ORDER — FENTANYL CITRATE (PF) 100 MCG/2ML IJ SOLN
INTRAMUSCULAR | Status: DC | PRN
  Administered 2021-08-15: 50 ug via INTRAVENOUS

## 2021-08-15 MED ORDER — LIDOCAINE HCL (CARDIAC) PF 100 MG/5ML IV SOSY
PREFILLED_SYRINGE | INTRAVENOUS | Status: DC | PRN
Start: 2021-08-15 — End: 2021-08-15
  Administered 2021-08-15: 100 mg via INTRAVENOUS

## 2021-08-15 MED ORDER — PROPOFOL 10 MG/ML IV BOLUS
INTRAVENOUS | Status: DC | PRN
Start: 2021-08-15 — End: 2021-08-15
  Administered 2021-08-15: 120 mg via INTRAVENOUS

## 2021-08-15 SURGICAL SUPPLY — 20 items
BAG DRAIN URO-CYSTO SKYTR STRL (DRAIN) ×2 IMPLANT
BAG DRN RND TRDRP ANRFLXCHMBR (UROLOGICAL SUPPLIES)
BAG DRN UROCATH (DRAIN) ×1
BAG URINE DRAIN 2000ML AR STRL (UROLOGICAL SUPPLIES) IMPLANT
BAG URINE LEG 500ML (DRAIN) IMPLANT
CATH FOLEY 2WAY SLVR  5CC 16FR (CATHETERS)
CATH FOLEY 2WAY SLVR 5CC 16FR (CATHETERS) IMPLANT
CLOTH BEACON ORANGE TIMEOUT ST (SAFETY) ×2 IMPLANT
ELECT REM PT RETURN 9FT ADLT (ELECTROSURGICAL) ×2
ELECTRODE REM PT RTRN 9FT ADLT (ELECTROSURGICAL) ×1 IMPLANT
GLOVE SURG ENC MOIS LTX SZ7.5 (GLOVE) ×2 IMPLANT
GLOVE SURG ENC MOIS LTX SZ8 (GLOVE) IMPLANT
GOWN STRL REUS W/TWL LRG LVL3 (GOWN DISPOSABLE) ×2 IMPLANT
KIT TURNOVER CYSTO (KITS) ×2 IMPLANT
MANIFOLD NEPTUNE II (INSTRUMENTS) ×2 IMPLANT
NEEDLE HYPO 22GX1.5 SAFETY (NEEDLE) IMPLANT
NS IRRIG 500ML POUR BTL (IV SOLUTION) IMPLANT
PACK CYSTO (CUSTOM PROCEDURE TRAY) ×2 IMPLANT
TUBE CONNECTING 12X1/4 (SUCTIONS) ×2 IMPLANT
WATER STERILE IRR 3000ML UROMA (IV SOLUTION) ×2 IMPLANT

## 2021-08-15 NOTE — Op Note (Signed)
Preoperative diagnosis: Bladder neoplasm of uncertain malignant potential, bladder erythema Postoperative diagnosis: Bladder neoplasm of uncertain malignant potential, bladder erythema  Procedure: Cystoscopy with bladder biopsy and fulguration 0.5 to 2 cm  Surgeon: Junious Silk  Anesthesia: General  Indication for procedure: Christy Cardenas is a 76 year old female with a history of low-grade TA bladder cancer.  On recent office cystoscopy she had what look like early papillary recurrence at the right bladder neck.  Findings: On exam under anesthesia the vulva appeared normal without lesion.  There was a small grade 1 rectocele.  Good apical and anterior support.  Normal urethral meatus.  The bladder and urethra were palpably normal.  On cystoscopy there was what looked to me like an old biopsy site at the right bladder neck that was gathering granulation tissue.  It looked more benign today but was sort of heaped up and coming together with a central crater.  This was biopsied and just more inferior to it biopsied.  This area was fulgurated for about 2 cm.  I thought it was less concerning for a solitary papillary lesion therefore I decided against giving postoperative chemo.  Another area was a conspicuous very small 8 mm area right posterior that was fulgurated and then a larger more conspicuous 12 mm area of erythema at the dome that was biopsied.  Otherwise there were no stones or foreign bodies in the bladder.  The ureteral orifice ease were in their normal orthotopic position with clear efflux.   Description of procedure: After consent was obtained patient brought to the operating room.  After adequate anesthesia she was placed in lithotomy position and prepped and draped in the usual sterile fashion.  Timeout was performed to confirm the patient and procedure.  Cystoscope was passed per urethra the bladder carefully inspected with a 30 degree and 70 degree lens.  I then took the cold cup biopsy forceps and  biopsied the right bladder neck x2.  This area was fulgurated.  Hemostasis was excellent.  Very small area right posterior was fulgurated.  I then used the cold cup biopsy forceps to biopsy the dome and this was fulgurated.  Hemostasis was excellent at low pressure at all the biopsy and fulguration sites.  The scope was removed and lidocaine jelly was instilled per urethra along with an exam under anesthesia.  She was awakened taken the cover room in stable condition.  Complications: None  Blood loss: Minimal  Specimens to pathology: Right bladder neck Bladder dome  Drains: None  Disposition: Patient stable to PACU

## 2021-08-15 NOTE — Discharge Instructions (Signed)

## 2021-08-15 NOTE — Transfer of Care (Signed)
Immediate Anesthesia Transfer of Care Note  Patient: Christy Cardenas  Procedure(s) Performed: CYSTOSCOPY WITH BLADDER BIOPSY/ FULGURATION/ (Bladder)  Patient Location: PACU  Anesthesia Type:General  Level of Consciousness: awake and patient cooperative  Airway & Oxygen Therapy: Patient Spontanous Breathing and Patient connected to nasal cannula oxygen  Post-op Assessment: Report given to RN and Post -op Vital signs reviewed and stable  Post vital signs: Reviewed and stable  Last Vitals:  Vitals Value Taken Time  BP    Temp    Pulse    Resp    SpO2      Last Pain:  Vitals:   08/15/21 0601  TempSrc: Oral  PainSc: 0-No pain      Patients Stated Pain Goal: 6 (17/79/39 0300)  Complications: No notable events documented.

## 2021-08-15 NOTE — Anesthesia Procedure Notes (Signed)
Procedure Name: LMA Insertion Date/Time: 08/15/2021 7:36 AM Performed by: Georgeanne Nim, CRNA Pre-anesthesia Checklist: Patient identified, Emergency Drugs available, Suction available, Patient being monitored and Timeout performed Patient Re-evaluated:Patient Re-evaluated prior to induction Oxygen Delivery Method: Circle system utilized Preoxygenation: Pre-oxygenation with 100% oxygen Induction Type: IV induction Ventilation: Mask ventilation without difficulty LMA: LMA inserted LMA Size: 4.0 Number of attempts: 1 Placement Confirmation: positive ETCO2, CO2 detector and breath sounds checked- equal and bilateral Tube secured with: Tape Dental Injury: Teeth and Oropharynx as per pre-operative assessment

## 2021-08-15 NOTE — Anesthesia Postprocedure Evaluation (Signed)
Anesthesia Post Note  Patient: Christy Cardenas  Procedure(s) Performed: CYSTOSCOPY WITH BLADDER BIOPSY/ FULGURATION/ (Bladder)     Patient location during evaluation: PACU Anesthesia Type: General Level of consciousness: awake and alert Pain management: pain level controlled Vital Signs Assessment: post-procedure vital signs reviewed and stable Respiratory status: spontaneous breathing, nonlabored ventilation, respiratory function stable and patient connected to nasal cannula oxygen Cardiovascular status: blood pressure returned to baseline and stable Postop Assessment: no apparent nausea or vomiting Anesthetic complications: no   No notable events documented.  Last Vitals:  Vitals:   08/15/21 0838 08/15/21 0920  BP: 132/81 (!) 150/89  Pulse: 79 75  Resp: 19 16  Temp:  36.4 C  SpO2: 96% 100%    Last Pain:  Vitals:   08/15/21 0920  TempSrc: Oral  PainSc: 0-No pain                 Barnet Glasgow

## 2021-08-15 NOTE — H&P (Signed)
H&P  Chief Complaint: Bladder erythema  History of Present Illness: Christy Cardenas is a 76 year old female with a history of low-grade TA bladder cancer.  Her last positive biopsy was in 2012.  She had a possible recurrence in 2016 with bladder erythema but this biopsy turned up inflammation.  Recent office cystoscopy November 2022 showed possible early papillary recurrence at the right inferior bladder neck. She did have a cytology May 2022 which was negative for high-grade urothelial carcinoma.  Her urinalysis in November was clear.  She has had no dysuria or gross hematuria.  No fever.  She has been well.  She had a little lightheaded when they were trying to start an IV in her left hand which proved unsuccessful.  She now has an IV on the right side.  She is feeling better.  She presents for cystoscopy with bladder biopsy.  Her last upper tract was in 2012, so I'll organize a CT scan a/p. She has had MH on and off over the years but last in 2017.   Past Medical History:  Diagnosis Date   Anxiety    Bladder cancer [facility]) urologist-  dr Chi Garlow   hx BCG tx's   Depression    GERD (gastroesophageal reflux disease)    Hypertension    Hypertensive retinopathy    OU   Urinary bladder neoplasm    Past Surgical History:  Procedure Laterality Date   CATARACT EXTRACTION Bilateral 2016   Dr. Lucita Ferrara   CATARACT EXTRACTION W/ INTRAOCULAR LENS  IMPLANT, BILATERAL  2016   CYSTO/  BLADDER TUMOR BX'S AND FULGERATION OF TUMOR'S  01-28-2006   CYSTO/  URETHRAL DILATION/  BILATERAL RETROGRADE PYELOGRAM/  RESECTION BLADDER TUMORS  03-13-2005   CYSTOSCOPY WITH BIOPSY  07/31/2011   Procedure: CYSTOSCOPY WITH BIOPSY;  Surgeon: Fredricka Bonine, MD;  Location: [facility];  Service: Urology;  Laterality: N/A;  BLADDER BIOPSY   CYSTOSCOPY WITH BIOPSY  08/15/2012   Procedure: CYSTOSCOPY WITH BIOPSY;  Surgeon: Fredricka Bonine, MD;  Location: [facility];  Service: Urology;  Laterality: N/A;   FULGURATION   CYSTOSCOPY WITH BIOPSY N/A 07/25/2015   Procedure: CYSTOSCOPY WITH BLADDER  BIOPSY FULGERATION;  Surgeon: Festus Aloe, MD;  Location: [facility];  Service: Urology;  Laterality: N/A;   CYSTOSCOPY/RETROGRADE/URETEROSCOPY  07/31/2011   Procedure: CYSTOSCOPY/RETROGRADE/URETEROSCOPY;  Surgeon: Fredricka Bonine, MD;  Location: [facility];  Service: Urology;  Laterality: Bilateral;  CYSTOSCOPY BILATERAL RETROGRADE PYELOGRAM URETEROSCOPY Right ureteral stent placement   EYE SURGERY Bilateral 2016   Cat Sx - Dr. Lucita Ferrara   ORIF RIGHT ANKLE FX  08/ 2012   [place]  as child   TRANSURETHRAL RESECTION OF BLADDER TUMOR WITH MITOMYCIN-C  [id number]   YAG LASER APPLICATION Bilateral 1191   Dr. Lucita Ferrara    Home Medications:  Medications Prior to Admission  Medication Sig Dispense Refill Last Dose   amLODipine (NORVASC) 2.5 MG tablet Take 2.5 mg by mouth every evening.   08/14/2021 at 2130   Calcium Carb-Cholecalciferol (CALCIUM 500 +D PO) Take 1 tablet by mouth 2 (two) times daily.    08/08/2021   calcium carbonate (OSCAL) 1500 (600 Ca) MG TABS tablet Take by mouth 2 (two) times daily with a meal.   08/08/2021   Cinnamon 500 MG capsule Take by mouth daily.   Past Month   escitalopram (LEXAPRO) 10 MG tablet Take 10 mg by mouth at bedtime.    08/14/2021 at 2130   fluticasone (FLONASE) 50 MCG/ACT  nasal spray SPRAY TWICE IN EACH NOSTRIL ONCE DAILY  12 Past Month   Glucosamine 500 MG CAPS Take by mouth 2 (two) times daily.   Past Week   lovastatin (MEVACOR) 40 MG tablet    08/14/2021 at 2130   naproxen sodium (ANAPROX) 220 MG tablet Take 220 mg by mouth 2 (two) times daily as needed. For pain   Past Week   Red Yeast Rice Extract 600 MG TABS Take 600 mg by mouth daily.   Past Month   Trospium Chloride 60 MG CP24 Take 1 capsule by mouth every evening.   08/07/2021   albuterol (VENTOLIN HFA) 108 (90 Base) MCG/ACT inhaler Inhale into the lungs.      FLUZONE  HIGH-DOSE 0.5 ML injection TO BE ADMINISTERED BY PHARMACIST FOR IMMUNIZATION  0    meloxicam (MOBIC) 15 MG tablet Take 1 tablet (15 mg total) by mouth daily. 30 tablet 1    Multiple Vitamin (MULTIVITAMIN WITH MINERALS) TABS Take 1 tablet by mouth daily.      ranitidine (ZANTAC) 150 MG tablet Take 150 mg by mouth as needed for heartburn.      vitamin E 180 MG (400 UNITS) capsule Take by mouth daily.   More than a month   Allergies: No Known Allergies  History reviewed. No pertinent family history. Social History:  reports that she has never smoked. She has never used smokeless tobacco. She reports current alcohol use. She reports that she does not use drugs.  ROS: A complete review of systems was performed.  All systems are negative except for pertinent findings as noted. Review of Systems  All other systems reviewed and are negative.   Physical Exam:  Vital signs in last 24 hours: Temp:  [98.6 F (37 C)] 98.6 F (37 C) (01/03 0601) Pulse Rate:  [72-77] 72 (01/03 0631) Resp:  [17] 17 (01/03 0601) BP: (118-167)/(78-104) 118/78 (01/03 0631) SpO2:  [94 %-96 %] 94 % (01/03 0631) Weight:  [86.8 kg] 86.8 kg (01/03 0601) General:  Alert and oriented, No acute distress HEENT: Normocephalic, atraumatic Cardiovascular: Regular rate and rhythm Lungs: Regular rate and effort Abdomen: Soft, nontender, nondistended, no abdominal masses Back: No CVA tenderness Extremities: No edema Neurologic: Grossly intact  Laboratory Data:  Results for orders placed or performed during the hospital encounter of 08/15/21 (from the past 24 hour(s))  I-STAT, chem 8     Status: Abnormal   Collection Time: 08/15/21  6:28 AM  Result Value Ref Range   Sodium 141 135 - 145 mmol/L   Potassium 4.4 3.5 - 5.1 mmol/L   Chloride 105 98 - 111 mmol/L   BUN 22 8 - 23 mg/dL   Creatinine, Ser 0.90 0.44 - 1.00 mg/dL   Glucose, Bld 107 (H) 70 - 99 mg/dL   Calcium, Ion 1.24 1.15 - 1.40 mmol/L   TCO2 23 22 - 32 mmol/L    Hemoglobin 13.6 12.0 - 15.0 g/dL   HCT 40.0 36.0 - 46.0 %   No results found for this or any previous visit (from the past 240 hour(s)). Creatinine: Recent Labs    08/15/21 0628  CREATININE 0.90    Impression/Assessment:  Bladder erythema, microhematuria.   Plan:  I discussed with the patient the nature, potential benefits, risks and alternatives to cystoscopy, bladder biopsy, fulguration possible TURBT, including side effects of the proposed treatment, the likelihood of the patient achieving the goals of the procedure, and any potential problems that might occur during the procedure or recuperation. All  questions answered. Patient elects to proceed.    Festus Aloe 08/15/2021, 7:19 AM

## 2021-08-16 ENCOUNTER — Encounter (HOSPITAL_BASED_OUTPATIENT_CLINIC_OR_DEPARTMENT_OTHER): Payer: Self-pay | Admitting: Urology

## 2021-08-16 LAB — SURGICAL PATHOLOGY

## 2023-05-09 ENCOUNTER — Other Ambulatory Visit: Payer: Self-pay | Admitting: Family Medicine

## 2023-05-09 DIAGNOSIS — Z1231 Encounter for screening mammogram for malignant neoplasm of breast: Secondary | ICD-10-CM

## 2023-05-20 ENCOUNTER — Ambulatory Visit
Admission: RE | Admit: 2023-05-20 | Discharge: 2023-05-20 | Disposition: A | Discharge status: Home / Self Care | Payer: Medicare PPO | Source: Ambulatory Visit | Attending: Family Medicine | Admitting: Family Medicine

## 2023-05-20 DIAGNOSIS — Z1231 Encounter for screening mammogram for malignant neoplasm of breast: Secondary | ICD-10-CM | POA: Insufficient documentation

## 2023-10-17 ENCOUNTER — Encounter: Payer: Self-pay | Admitting: Dermatology

## 2023-10-17 ENCOUNTER — Ambulatory Visit: Payer: Medicare PPO | Admitting: Dermatology

## 2023-10-17 DIAGNOSIS — L814 Other melanin hyperpigmentation: Secondary | ICD-10-CM | POA: Diagnosis not present

## 2023-10-17 DIAGNOSIS — Z85828 Personal history of other malignant neoplasm of skin: Secondary | ICD-10-CM

## 2023-10-17 DIAGNOSIS — D1801 Hemangioma of skin and subcutaneous tissue: Secondary | ICD-10-CM

## 2023-10-17 DIAGNOSIS — L821 Other seborrheic keratosis: Secondary | ICD-10-CM | POA: Diagnosis not present

## 2023-10-17 DIAGNOSIS — W908XXA Exposure to other nonionizing radiation, initial encounter: Secondary | ICD-10-CM | POA: Diagnosis not present

## 2023-10-17 DIAGNOSIS — L578 Other skin changes due to chronic exposure to nonionizing radiation: Secondary | ICD-10-CM

## 2023-10-17 DIAGNOSIS — L820 Inflamed seborrheic keratosis: Secondary | ICD-10-CM | POA: Diagnosis not present

## 2023-10-17 DIAGNOSIS — D229 Melanocytic nevi, unspecified: Secondary | ICD-10-CM

## 2023-10-17 DIAGNOSIS — L918 Other hypertrophic disorders of the skin: Secondary | ICD-10-CM

## 2023-10-17 NOTE — Progress Notes (Signed)
 New Patient Visit   Subjective  Christy Cardenas is a 78 y.o. female who presents for the following: pt has places on back she would like looked at that are irritating and itching. Pt reports hx BCC at nose treated with Dr. Dorinda Hill in [place] many years ago, no family hx of skin cancer.   The patient has spots, moles and lesions to be evaluated, some may be new or changing and the patient may have concern these could be cancer.   The following portions of the chart were reviewed this encounter and updated as appropriate: medications, allergies, medical history  Review of Systems:  No other skin or systemic complaints except as noted in HPI or Assessment and Plan.  Objective  Well appearing patient in no apparent distress; mood and affect are within normal limits.   A focused examination was performed of the following areas: Waist-up exam: head neck, scalp, back chest abdomen arms hands  Relevant exam findings are noted in the Assessment and Plan.  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  - Call if any new or changing lesions are noted between office visits   Acrochordons (Skin Tags) - Fleshy, skin-colored pedunculated papules - Benign appearing.  - Observe. - If desired, they can be removed with an in office procedure that is not covered by insurance. - Please call the clinic if you notice any new or changing lesions.   LENTIGINES, SEBORRHEIC KERATOSES, HEMANGIOMAS - Benign normal skin lesions - Benign-appearing - Call for any changes  MELANOCYTIC NEVI - Tan-brown and/or pink-flesh-colored symmetric macules and papules - Benign appearing on exam today - Observation -  Call clinic for new or changing moles - Recommend daily use of broad spectrum spf 30+ sunscreen to sun-exposed areas.   ACTINIC DAMAGE - Chronic condition, secondary to cumulative UV/sun exposure - diffuse scaly erythematous macules with underlying dyspigmentation - Recommend daily broad spectrum sunscreen SPF 30+ to sun-exposed areas, reapply every 2 hours as needed.  - Staying in the shade or wearing long sleeves, sun glasses (UVA+UVB protection) and wide brim hats (4-inch brim around the entire circumference of the hat) are also recommended for sun protection.  - Call for new or changing lesions.  SKIN CANCER SCREENING PERFORMED TODAY  INFLAMED SEBORRHEIC KERATOSIS (23) Back x 21, R chest x1, R shoulder x1 (23) Symptomatic, irritating, patient would like treated. Destruction of lesion - Back x 21, R chest x1, R shoulder x1 (23) Complexity: simple   Destruction method: cryotherapy   Informed consent: discussed and consent obtained   Timeout:  patient name, date of birth, surgical site, and procedure verified Lesion destroyed using liquid nitrogen: Yes   Region frozen until ice ball extended beyond lesion: Yes   Cryo cycles: 1 or 2. Outcome: patient tolerated procedure well with no complications   Post-procedure details: wound care instructions given   LENTIGINES   ACTINIC ELASTOSIS   SEBORRHEIC KERATOSES   MULTIPLE BENIGN NEVI   CHERRY ANGIOMA   ACROCHORDON    Return if symptoms worsen or fail to improve.  I, Soundra Pilon, CMA, am acting as scribe for Elie Goody, MD .   Documentation: I have  reviewed the above documentation for accuracy and completeness, and I agree with the above.  Elie Goody, MD

## 2023-10-17 NOTE — Patient Instructions (Addendum)

## 2024-08-26 ENCOUNTER — Other Ambulatory Visit: Payer: Self-pay | Admitting: Family Medicine

## 2024-08-26 DIAGNOSIS — Z1231 Encounter for screening mammogram for malignant neoplasm of breast: Secondary | ICD-10-CM

## 2024-08-28 ENCOUNTER — Ambulatory Visit
Admission: RE | Admit: 2024-08-28 | Discharge: 2024-08-28 | Disposition: A | Discharge status: Home / Self Care | Source: Ambulatory Visit | Attending: Family Medicine | Admitting: Family Medicine

## 2024-08-28 DIAGNOSIS — Z1231 Encounter for screening mammogram for malignant neoplasm of breast: Secondary | ICD-10-CM | POA: Diagnosis present
